# Patient Record
Sex: Female | Born: 1970
Health system: Southern US, Community
[De-identification: ages and names within clinical notes are randomized; demographics above are authoritative.]

## PROBLEM LIST (undated history)

## (undated) DIAGNOSIS — F329 Major depressive disorder, single episode, unspecified: Secondary | ICD-10-CM

## (undated) DIAGNOSIS — F32A Depression, unspecified: Secondary | ICD-10-CM

## (undated) HISTORY — PX: ABDOMINAL HYSTERECTOMY: SHX81

## (undated) HISTORY — DX: Major depressive disorder, single episode, unspecified: F32.9

## (undated) HISTORY — DX: Depression, unspecified: F32.A

---

## 2005-09-20 ENCOUNTER — Inpatient Hospital Stay: Payer: Self-pay | Admitting: Obstetrics and Gynecology

## 2006-10-20 ENCOUNTER — Ambulatory Visit: Payer: Self-pay | Admitting: Obstetrics and Gynecology

## 2014-02-11 ENCOUNTER — Ambulatory Visit: Payer: Self-pay | Admitting: Internal Medicine

## 2014-02-12 ENCOUNTER — Ambulatory Visit: Payer: Self-pay | Admitting: Internal Medicine

## 2016-04-06 ENCOUNTER — Other Ambulatory Visit: Payer: Self-pay | Admitting: Internal Medicine

## 2016-04-06 DIAGNOSIS — F329 Major depressive disorder, single episode, unspecified: Secondary | ICD-10-CM | POA: Diagnosis not present

## 2016-04-06 DIAGNOSIS — Z124 Encounter for screening for malignant neoplasm of cervix: Secondary | ICD-10-CM | POA: Diagnosis not present

## 2016-04-06 DIAGNOSIS — Z1231 Encounter for screening mammogram for malignant neoplasm of breast: Secondary | ICD-10-CM

## 2016-04-06 DIAGNOSIS — E2839 Other primary ovarian failure: Secondary | ICD-10-CM | POA: Diagnosis not present

## 2016-04-06 DIAGNOSIS — Z0001 Encounter for general adult medical examination with abnormal findings: Secondary | ICD-10-CM | POA: Diagnosis not present

## 2016-05-11 ENCOUNTER — Other Ambulatory Visit: Payer: Self-pay | Admitting: Internal Medicine

## 2016-05-11 ENCOUNTER — Ambulatory Visit
Admission: RE | Admit: 2016-05-11 | Discharge: 2016-05-11 | Disposition: A | Discharge status: Home / Self Care | Payer: BLUE CROSS/BLUE SHIELD | Source: Ambulatory Visit | Attending: Internal Medicine | Admitting: Internal Medicine

## 2016-05-11 DIAGNOSIS — Z1231 Encounter for screening mammogram for malignant neoplasm of breast: Secondary | ICD-10-CM | POA: Insufficient documentation

## 2016-06-01 DIAGNOSIS — Z23 Encounter for immunization: Secondary | ICD-10-CM | POA: Diagnosis not present

## 2016-08-13 DIAGNOSIS — K011 Impacted teeth: Secondary | ICD-10-CM | POA: Diagnosis not present

## 2016-08-13 DIAGNOSIS — K046 Periapical abscess with sinus: Secondary | ICD-10-CM | POA: Diagnosis not present

## 2017-02-10 DIAGNOSIS — J069 Acute upper respiratory infection, unspecified: Secondary | ICD-10-CM | POA: Diagnosis not present

## 2017-02-10 DIAGNOSIS — J029 Acute pharyngitis, unspecified: Secondary | ICD-10-CM | POA: Diagnosis not present

## 2017-02-14 ENCOUNTER — Other Ambulatory Visit: Payer: Self-pay | Admitting: Internal Medicine

## 2017-02-14 ENCOUNTER — Ambulatory Visit
Admission: RE | Admit: 2017-02-14 | Discharge: 2017-02-14 | Disposition: A | Discharge status: Home / Self Care | Payer: BLUE CROSS/BLUE SHIELD | Source: Ambulatory Visit | Attending: Internal Medicine | Admitting: Internal Medicine

## 2017-02-14 ENCOUNTER — Ambulatory Visit
Admission: AD | Admit: 2017-02-14 | Discharge: 2017-02-14 | Disposition: A | Discharge status: Home / Self Care | Payer: BLUE CROSS/BLUE SHIELD | Source: Ambulatory Visit | Attending: Internal Medicine | Admitting: Internal Medicine

## 2017-02-14 DIAGNOSIS — R059 Cough, unspecified: Secondary | ICD-10-CM

## 2017-02-14 DIAGNOSIS — R05 Cough: Secondary | ICD-10-CM

## 2017-02-14 DIAGNOSIS — R0602 Shortness of breath: Secondary | ICD-10-CM

## 2017-02-14 DIAGNOSIS — R0600 Dyspnea, unspecified: Secondary | ICD-10-CM | POA: Diagnosis not present

## 2017-02-14 DIAGNOSIS — J45991 Cough variant asthma: Secondary | ICD-10-CM | POA: Diagnosis not present

## 2017-05-04 DIAGNOSIS — J309 Allergic rhinitis, unspecified: Secondary | ICD-10-CM | POA: Diagnosis not present

## 2017-05-04 DIAGNOSIS — F39 Unspecified mood [affective] disorder: Secondary | ICD-10-CM | POA: Diagnosis not present

## 2017-05-04 DIAGNOSIS — E785 Hyperlipidemia, unspecified: Secondary | ICD-10-CM | POA: Diagnosis not present

## 2017-05-04 DIAGNOSIS — Z0001 Encounter for general adult medical examination with abnormal findings: Secondary | ICD-10-CM | POA: Diagnosis not present

## 2017-05-18 ENCOUNTER — Other Ambulatory Visit: Payer: Self-pay | Admitting: Nurse Practitioner

## 2017-05-18 DIAGNOSIS — Z1231 Encounter for screening mammogram for malignant neoplasm of breast: Secondary | ICD-10-CM

## 2017-05-31 DIAGNOSIS — Z23 Encounter for immunization: Secondary | ICD-10-CM | POA: Diagnosis not present

## 2017-06-14 ENCOUNTER — Ambulatory Visit
Admission: RE | Admit: 2017-06-14 | Discharge: 2017-06-14 | Disposition: A | Discharge status: Home / Self Care | Payer: BLUE CROSS/BLUE SHIELD | Source: Ambulatory Visit | Attending: Nurse Practitioner | Admitting: Nurse Practitioner

## 2017-06-14 DIAGNOSIS — Z1231 Encounter for screening mammogram for malignant neoplasm of breast: Secondary | ICD-10-CM | POA: Diagnosis not present

## 2018-04-28 ENCOUNTER — Other Ambulatory Visit: Payer: Self-pay | Admitting: Internal Medicine

## 2018-05-08 ENCOUNTER — Ambulatory Visit (INDEPENDENT_AMBULATORY_CARE_PROVIDER_SITE_OTHER): Payer: BLUE CROSS/BLUE SHIELD | Admitting: Nurse Practitioner

## 2018-05-08 ENCOUNTER — Encounter: Payer: Self-pay | Admitting: Nurse Practitioner

## 2018-05-08 VITALS — BP 118/70 | HR 66 | Resp 16 | Ht 68.0 in | Wt 160.4 lb

## 2018-05-08 DIAGNOSIS — Z1231 Encounter for screening mammogram for malignant neoplasm of breast: Secondary | ICD-10-CM

## 2018-05-08 DIAGNOSIS — N959 Unspecified menopausal and perimenopausal disorder: Secondary | ICD-10-CM | POA: Diagnosis not present

## 2018-05-08 DIAGNOSIS — F411 Generalized anxiety disorder: Secondary | ICD-10-CM

## 2018-05-08 DIAGNOSIS — Z0001 Encounter for general adult medical examination with abnormal findings: Secondary | ICD-10-CM

## 2018-05-08 DIAGNOSIS — R300 Dysuria: Secondary | ICD-10-CM

## 2018-05-08 DIAGNOSIS — E559 Vitamin D deficiency, unspecified: Secondary | ICD-10-CM | POA: Insufficient documentation

## 2018-05-08 DIAGNOSIS — Z1239 Encounter for other screening for malignant neoplasm of breast: Secondary | ICD-10-CM

## 2018-05-08 NOTE — Progress Notes (Signed)
[facility] [address] [place], [place] [address]  Internal MEDICINE  Office Visit Note  Patient Name: Heather Wolf  [id number]  [id number]  Date of Service: 05/08/2018   Pt is here for routine health maintenance examination  Chief Complaint  Patient presents with  . Annual Exam  . Depression  . Quality Metric Gaps    mammogram      The patient presents with no concerns or complaints. She is due to have routine, fasting labs done. She is also due to have screening mammogram.     Current Medication: Outpatient Encounter Medications as of 05/08/2018  Medication Sig  . estradiol (ESTRACE) 1 MG tablet TAKE 1 TABLET BY MOUTH EVERY DAY  . sertraline (ZOLOFT) 50 MG tablet TAKE 1 TABLET BY MOUTH EVERY DAY   No facility-administered encounter medications on file as of 05/08/2018.     Surgical History: Past Surgical History:  Procedure Laterality Date  . ABDOMINAL HYSTERECTOMY      Medical History: Past Medical History:  Diagnosis Date  . Depression     Family History: Family History  Problem Relation Age of Onset  . Hypertension Mother   . Heart attack Father       Review of Systems  Constitutional: Negative for activity change, chills, fatigue and unexpected weight change.  HENT: Negative for congestion, postnasal drip, rhinorrhea, sneezing and sore throat.   Eyes: Negative.  Negative for redness.  Respiratory: Negative for cough, chest tightness, shortness of breath and wheezing.   Cardiovascular: Negative for chest pain and palpitations.  Gastrointestinal: Negative for abdominal pain, constipation, diarrhea and nausea.  Endocrine: Negative for cold intolerance, heat intolerance, polydipsia, polyphagia and polyuria.  Genitourinary: Negative.  Negative for dysuria and frequency.  Musculoskeletal: Negative for arthralgias, back pain, joint swelling and neck pain.  Skin: Negative for rash.  Allergic/Immunologic: Negative for environmental  allergies.  Neurological: Negative for dizziness, tremors, numbness and headaches.  Hematological: Negative for adenopathy. Does not bruise/bleed easily.  Psychiatric/Behavioral: Negative for behavioral problems (Depression), sleep disturbance and suicidal ideas. The patient is nervous/anxious.      Today's Vitals   05/08/18 0915  BP: 118/70  Pulse: 66  Resp: 16  SpO2: 99%  Weight: 160 lb 6.4 oz (72.8 kg)  Height: 5\' 8"  (1.727 m)    Physical Exam  Constitutional: She is oriented to person, place, and time. She appears well-developed and well-nourished. No distress.  HENT:  Head: Normocephalic and atraumatic.  Nose: Nose normal.  Mouth/Throat: Oropharynx is clear and moist. No oropharyngeal exudate.  Eyes: Pupils are equal, round, and reactive to light. Conjunctivae and EOM are normal.  Neck: Normal range of motion. Neck supple. No JVD present. No tracheal deviation present. No thyromegaly present.  Cardiovascular: Normal rate, regular rhythm, normal heart sounds and intact distal pulses. Exam reveals no gallop and no friction rub.  No murmur heard. Pulmonary/Chest: Effort normal and breath sounds normal. No respiratory distress. She has no wheezes. She has no rales. She exhibits no tenderness. Right breast exhibits no inverted nipple, no mass, no nipple discharge, no skin change and no tenderness. Left breast exhibits no inverted nipple, no mass, no nipple discharge, no skin change and no tenderness.  Abdominal: Soft. Bowel sounds are normal. There is no tenderness.  Musculoskeletal: Normal range of motion.  Lymphadenopathy:    She has no cervical adenopathy.  Neurological: She is alert and oriented to person, place, and time. No cranial nerve deficit.  Skin: Skin is warm and  dry. Capillary refill takes less than 2 seconds. She is not diaphoretic.  Psychiatric: She has a normal mood and affect. Her behavior is normal. Judgment and thought content normal.  Nursing note and vitals  reviewed.  Assessment/Plan: 1. Encounter for general adult medical examination with abnormal findings Annual health maintenance exam today. - CBC with Differential/Platelet - Comprehensive metabolic panel - T4, free - TSH - Lipid panel  2. Unspecified menopausal and perimenopausal disorder Continue estradiol therapy daily.   3. Generalized anxiety disorder Well managed. Continue sertraline as prescribed.   4. Vitamin D deficiency - Vitamin D 1,25 dihydroxy  5. Screening for breast cancer - MM DIGITAL SCREENING BILATERAL; Future  6. Dysuria - UA/M w/rflx Culture, Routine  General Counseling: [name] verbalizes understanding of the findings of todays visit and agrees with plan of treatment. I have discussed any further diagnostic evaluation that may be needed or ordered today. We also reviewed her medications today. she has been encouraged to call the office with any questions or concerns that should arise related to todays visit.    Counseling:  This patient was seen by Vincent Gros FNP Collaboration with Dr Lyndon Code as a part of collaborative care agreement  Orders Placed This Encounter  Procedures  . MM DIGITAL SCREENING BILATERAL  . UA/M w/rflx Culture, Routine  . CBC with Differential/Platelet  . Comprehensive metabolic panel  . T4, free  . TSH  . Lipid panel  . Vitamin D 1,25 dihydroxy   .  Time spent: 17 Minutes      Lyndon Code, MD  Internal Medicine

## 2018-05-09 LAB — UA/M W/RFLX CULTURE, ROUTINE
Bilirubin, UA: NEGATIVE
Glucose, UA: NEGATIVE
KETONES UA: NEGATIVE
LEUKOCYTES UA: NEGATIVE
NITRITE UA: NEGATIVE
Protein, UA: NEGATIVE
RBC UA: NEGATIVE
Specific Gravity, UA: 1.006 (ref 1.005–1.030)
UUROB: 0.2 mg/dL (ref 0.2–1.0)
pH, UA: 5.5 (ref 5.0–7.5)

## 2018-05-09 LAB — MICROSCOPIC EXAMINATION: CASTS: NONE SEEN /LPF

## 2018-05-16 ENCOUNTER — Other Ambulatory Visit: Payer: Self-pay | Admitting: Nurse Practitioner

## 2018-05-16 DIAGNOSIS — E559 Vitamin D deficiency, unspecified: Secondary | ICD-10-CM | POA: Diagnosis not present

## 2018-05-16 DIAGNOSIS — Z0001 Encounter for general adult medical examination with abnormal findings: Secondary | ICD-10-CM | POA: Diagnosis not present

## 2018-05-17 LAB — COMPREHENSIVE METABOLIC PANEL
ALT: 11 IU/L (ref 0–32)
AST: 13 IU/L (ref 0–40)
Albumin/Globulin Ratio: 2.2 (ref 1.2–2.2)
Albumin: 4.4 g/dL (ref 3.5–5.5)
Alkaline Phosphatase: 63 IU/L (ref 39–117)
BUN/Creatinine Ratio: 19 (ref 9–23)
BUN: 15 mg/dL (ref 6–24)
Bilirubin Total: 0.3 mg/dL (ref 0.0–1.2)
CALCIUM: 9.1 mg/dL (ref 8.7–10.2)
CO2: 26 mmol/L (ref 20–29)
CREATININE: 0.77 mg/dL (ref 0.57–1.00)
Chloride: 101 mmol/L (ref 96–106)
GFR calc Af Amer: 107 mL/min/{1.73_m2} (ref >59)
GFR, EST NON AFRICAN AMERICAN: 93 mL/min/{1.73_m2} (ref >59)
GLUCOSE: 82 mg/dL (ref 65–99)
Globulin, Total: 2 g/dL (ref 1.5–4.5)
POTASSIUM: 4.1 mmol/L (ref 3.5–5.2)
Sodium: 141 mmol/L (ref 134–144)
Total Protein: 6.4 g/dL (ref 6.0–8.5)

## 2018-05-17 LAB — CBC
HEMOGLOBIN: 13.5 g/dL (ref 11.1–15.9)
Hematocrit: 40 % (ref 34.0–46.6)
MCH: 29.9 pg (ref 26.6–33.0)
MCHC: 33.8 g/dL (ref 31.5–35.7)
MCV: 89 fL (ref 79–97)
Platelets: 223 10*3/uL (ref 150–450)
RBC: 4.52 x10E6/uL (ref 3.77–5.28)
RDW: 13 % (ref 12.3–15.4)
WBC: 4.3 10*3/uL (ref 3.4–10.8)

## 2018-05-17 LAB — LIPID PANEL W/O CHOL/HDL RATIO
CHOLESTEROL TOTAL: 217 mg/dL — AB (ref 100–199)
HDL: 79 mg/dL (ref >39)
LDL CALC: 125 mg/dL — AB (ref 0–99)
Triglycerides: 66 mg/dL (ref 0–149)
VLDL CHOLESTEROL CAL: 13 mg/dL (ref 5–40)

## 2018-05-17 LAB — TSH: TSH: 1.16 u[IU]/mL (ref 0.450–4.500)

## 2018-05-17 LAB — T4, FREE: Free T4: 1.25 ng/dL (ref 0.82–1.77)

## 2018-05-17 LAB — T3: T3, Total: 113 ng/dL (ref 71–180)

## 2018-05-17 LAB — VITAMIN D 25 HYDROXY (VIT D DEFICIENCY, FRACTURES): VIT D 25 HYDROXY: 49.6 ng/mL (ref 30.0–100.0)

## 2018-05-18 ENCOUNTER — Telehealth: Payer: Self-pay

## 2018-05-18 NOTE — Telephone Encounter (Signed)
Left message on pt voicemail letting her know lab results for cholesterol and informed her to give me a call back with any concerns or questions

## 2018-05-18 NOTE — Telephone Encounter (Signed)
-----   Message from Carlean Jews, NP sent at 05/18/2018  8:14 AM EDT ----- Please let the patient know that labs indicate a mild elevation of cholesterol panel. We could send her out prudent diet information. All other labs were good. Thanks.

## 2018-08-21 ENCOUNTER — Ambulatory Visit
Admission: RE | Admit: 2018-08-21 | Discharge: 2018-08-21 | Disposition: A | Discharge status: Home / Self Care | Payer: BLUE CROSS/BLUE SHIELD | Source: Ambulatory Visit | Attending: Nurse Practitioner | Admitting: Nurse Practitioner

## 2018-08-21 DIAGNOSIS — Z1239 Encounter for other screening for malignant neoplasm of breast: Secondary | ICD-10-CM

## 2018-08-21 DIAGNOSIS — Z1231 Encounter for screening mammogram for malignant neoplasm of breast: Secondary | ICD-10-CM | POA: Diagnosis not present

## 2018-10-23 ENCOUNTER — Other Ambulatory Visit: Payer: Self-pay

## 2018-10-23 MED ORDER — SERTRALINE HCL 50 MG PO TABS: 50.0000 mg | ORAL_TABLET | Freq: Every day | ORAL | 1 refills | Status: DC

## 2018-10-23 MED ORDER — ESTRADIOL 1 MG PO TABS: 1.0000 mg | ORAL_TABLET | Freq: Every day | ORAL | 1 refills | Status: DC

## 2019-04-19 ENCOUNTER — Other Ambulatory Visit: Payer: Self-pay

## 2019-04-19 MED ORDER — SERTRALINE HCL 50 MG PO TABS: 50.0000 mg | ORAL_TABLET | Freq: Every day | ORAL | 0 refills | Status: DC

## 2019-04-19 MED ORDER — ESTRADIOL 1 MG PO TABS: 1.0000 mg | ORAL_TABLET | Freq: Every day | ORAL | 0 refills | Status: DC

## 2019-04-19 NOTE — Telephone Encounter (Signed)
Spoke with pt advised she need keep appt for further refills and she only come once a year

## 2019-04-23 ENCOUNTER — Other Ambulatory Visit: Payer: Self-pay

## 2019-04-23 MED ORDER — SERTRALINE HCL 50 MG PO TABS: 50.0000 mg | ORAL_TABLET | Freq: Every day | ORAL | 0 refills | Status: DC

## 2019-04-23 MED ORDER — ESTRADIOL 1 MG PO TABS: 1.0000 mg | ORAL_TABLET | Freq: Every day | ORAL | 0 refills | Status: DC

## 2019-05-11 ENCOUNTER — Encounter: Payer: Self-pay | Admitting: Nurse Practitioner

## 2019-05-11 ENCOUNTER — Other Ambulatory Visit: Payer: Self-pay

## 2019-05-11 ENCOUNTER — Ambulatory Visit: Payer: BC Managed Care – PPO | Admitting: Nurse Practitioner

## 2019-05-11 VITALS — BP 127/87 | HR 86 | Temp 97.7°F | Resp 16 | Ht 68.0 in | Wt 167.0 lb

## 2019-05-11 DIAGNOSIS — R300 Dysuria: Secondary | ICD-10-CM | POA: Diagnosis not present

## 2019-05-11 DIAGNOSIS — I7300 Raynaud's syndrome without gangrene: Secondary | ICD-10-CM | POA: Diagnosis not present

## 2019-05-11 DIAGNOSIS — E042 Nontoxic multinodular goiter: Secondary | ICD-10-CM | POA: Diagnosis not present

## 2019-05-11 DIAGNOSIS — Z0001 Encounter for general adult medical examination with abnormal findings: Secondary | ICD-10-CM

## 2019-05-11 DIAGNOSIS — R5383 Other fatigue: Secondary | ICD-10-CM

## 2019-05-11 NOTE — Progress Notes (Signed)
[facility] [place], [place] [address]  Internal MEDICINE  Office Visit Note  Patient Name: Heather Wolf  [id number]  [id number]  Date of Service: 05/11/2019   Pt is here for routine health maintenance examination  Chief Complaint  Patient presents with  . Annual Exam    pt has felt a lot change in moods, believes it could be due to hormone changes  . Depression    feels a lot more anxious  . Weight Gain     Ms. Hirst presents today for an annual physical exam. She reports symptoms of mood irritability and lability, an ongoing increase in weight and appetite beginning last year, decreased energy levels, increased anxiety with occasional panic attacks, decreased libido, intermittent cold intolerance, and hot flashes. She also reports increased work/life stress, including heavier workload, being in the process of selling her home, social isolation related to living an hour away from most family and friends, and her daughter being home from college. She states that she has recently ordered and started taking Plexus for appetite control and for gastrointestinal regulation.    Current Medication: Outpatient Encounter Medications as of 05/11/2019  Medication Sig  . estradiol (ESTRACE) 1 MG tablet Take 1 tablet (1 mg total) by mouth daily.  . sertraline (ZOLOFT) 50 MG tablet Take 1 tablet (50 mg total) by mouth daily.   No facility-administered encounter medications on file as of 05/11/2019.     Surgical History: Past Surgical History:  Procedure Laterality Date  . ABDOMINAL HYSTERECTOMY      Medical History: Past Medical History:  Diagnosis Date  . Depression     Family History: Family History  Problem Relation Age of Onset  . Hypertension Mother   . Heart attack Father   . Breast cancer Neg Hx       Review of Systems  Constitutional: Positive for appetite change and fatigue. Negative for activity change, chills, diaphoresis and fever.   HENT: Negative for congestion, postnasal drip, rhinorrhea and sore throat.   Respiratory: Negative for cough and wheezing.   Cardiovascular: Negative for chest pain and palpitations.  Gastrointestinal: Negative for constipation, diarrhea, nausea and vomiting.  Endocrine: Positive for cold intolerance. Negative for polydipsia and polyuria.  Genitourinary: Negative for dyspareunia and frequency.  Musculoskeletal: Negative for arthralgias and myalgias.  Skin: Negative.   Allergic/Immunologic: Negative.   Neurological: Negative for dizziness and headaches.  Hematological: Negative.   Psychiatric/Behavioral: Positive for dysphoric mood. The patient is nervous/anxious.     Today's Vitals   05/11/19 0916  BP: 127/87  Pulse: 86  Resp: 16  Temp: 97.7 F (36.5 C)  SpO2: 98%  Weight: 167 lb (75.8 kg)  Height: 5\' 8"  (1.727 m)   Body mass index is 25.39 kg/m.  Physical Exam Vitals signs and nursing note reviewed.  Constitutional:      General: She is not in acute distress.    Appearance: Normal appearance. She is not ill-appearing.  HENT:     Head: Normocephalic and atraumatic.     Nose: Nose normal.  Eyes:     Extraocular Movements: Extraocular movements intact.     Pupils: Pupils are equal, round, and reactive to light.  Neck:     Musculoskeletal: Normal range of motion and neck supple.     Thyroid: Thyromegaly present. No thyroid mass or thyroid tenderness.     Vascular: No carotid bruit.     Trachea: Trachea normal.  Cardiovascular:     Rate and  Rhythm: Normal rate and regular rhythm.     Pulses:          Radial pulses are 2+ on the right side and 2+ on the left side.       Dorsalis pedis pulses are 1+ on the right side and 1+ on the left side.     Heart sounds: Normal heart sounds.  Pulmonary:     Effort: Pulmonary effort is normal.     Breath sounds: Normal breath sounds.  Chest:     Chest wall: No mass, lacerations, deformity, swelling, tenderness, crepitus or edema.      Breasts: Tanner Score is 5. Breasts are symmetrical.        Right: Normal. No swelling, bleeding, inverted nipple, mass, nipple discharge, skin change or tenderness.        Left: Normal. No swelling, bleeding, inverted nipple, mass, nipple discharge, skin change or tenderness.  Abdominal:     General: Abdomen is flat. Bowel sounds are normal. There is no distension.     Palpations: Abdomen is soft. There is no mass.     Tenderness: There is no abdominal tenderness. There is no guarding or rebound.     Hernia: No hernia is present.  Musculoskeletal:     Right lower leg: No edema.     Left lower leg: No edema.  Lymphadenopathy:     Cervical: No cervical adenopathy.     Upper Body:     Right upper body: No supraclavicular, axillary or pectoral adenopathy.     Left upper body: No supraclavicular, axillary or pectoral adenopathy.  Skin:    General: Skin is warm and dry.     Capillary Refill: Capillary refill takes less than 2 seconds.     Coloration: Skin is not jaundiced or pale.     Findings: No bruising, erythema, lesion or rash.  Neurological:     Mental Status: She is alert and oriented to person, place, and time.     Deep Tendon Reflexes:     Reflex Scores:      Patellar reflexes are 1+ on the right side and 1+ on the left side. Psychiatric:        Mood and Affect: Mood normal.        Behavior: Behavior normal.        Thought Content: Thought content normal.        Judgment: Judgment normal.    Assessment/Plan: 1. Encounter for general adult medical examination with abnormal findings Annul health maintenance exam today  2. Other fatigue Check routie, fasting labs including thyroid panel and anemia panel.   3. Raynaud's disease without gangrene Connective tissue panel ordered. Will discuss with patient at next visit   4. Multinodular goiter Check thyroid panel with thyroid autoantibodies. Thyroid ultrasound ordered for further evaluation.  - US Soft Tissue Head/Neck;  Future  5. Dysuria - UA/M w/rflx Culture, Routine  General Counseling: Heather Wolf verbalizes understanding of the findings of todays visit and agrees with plan of treatment. I have discussed any further diagnostic evaluation that may be needed or ordered today. We also reviewed her medications today. she has been encouraged to call the office with any questions or concerns that should arise related to todays visit.    Counseling:  This patient was seen by Vincent GrosHeather Olga Bourbeau FNP Collaboration with Dr Lyndon CodeFozia M Khan as a part of collaborative care agreement  Orders Placed This Encounter  Procedures  . US Soft Tissue Head/Neck  . UA/M w/rflx Culture,  Routine    Time spent: 23 Minutes      Lyndon Code, MD  Internal Medicine

## 2019-05-12 LAB — UA/M W/RFLX CULTURE, ROUTINE
Bilirubin, UA: NEGATIVE
Glucose, UA: NEGATIVE
Ketones, UA: NEGATIVE
Leukocytes,UA: NEGATIVE
Nitrite, UA: NEGATIVE
Protein,UA: NEGATIVE
RBC, UA: NEGATIVE
Specific Gravity, UA: 1.005 (ref 1.005–1.030)
Urobilinogen, Ur: 0.2 mg/dL (ref 0.2–1.0)
pH, UA: 6.5 (ref 5.0–7.5)

## 2019-05-12 LAB — MICROSCOPIC EXAMINATION
Casts: NONE SEEN /lpf
RBC: NONE SEEN /hpf (ref 0–2)

## 2019-05-14 ENCOUNTER — Other Ambulatory Visit: Payer: Self-pay | Admitting: Nurse Practitioner

## 2019-05-14 DIAGNOSIS — E559 Vitamin D deficiency, unspecified: Secondary | ICD-10-CM | POA: Diagnosis not present

## 2019-05-14 DIAGNOSIS — Z0001 Encounter for general adult medical examination with abnormal findings: Secondary | ICD-10-CM | POA: Diagnosis not present

## 2019-05-14 DIAGNOSIS — R5383 Other fatigue: Secondary | ICD-10-CM | POA: Diagnosis not present

## 2019-05-14 DIAGNOSIS — I7300 Raynaud's syndrome without gangrene: Secondary | ICD-10-CM | POA: Diagnosis not present

## 2019-05-16 LAB — CBC
Hematocrit: 41 % (ref 34.0–46.6)
Hemoglobin: 13.7 g/dL (ref 11.1–15.9)
MCH: 29.7 pg (ref 26.6–33.0)
MCHC: 33.4 g/dL (ref 31.5–35.7)
MCV: 89 fL (ref 79–97)
Platelets: 245 x10E3/uL (ref 150–450)
RBC: 4.61 x10E6/uL (ref 3.77–5.28)
RDW: 11.9 % (ref 11.7–15.4)
WBC: 4.3 x10E3/uL (ref 3.4–10.8)

## 2019-05-16 LAB — COMPREHENSIVE METABOLIC PANEL WITH GFR
ALT: 14 IU/L (ref 0–32)
AST: 17 IU/L (ref 0–40)
Albumin/Globulin Ratio: 2.3 — ABNORMAL HIGH (ref 1.2–2.2)
Albumin: 4.8 g/dL (ref 3.8–4.8)
Alkaline Phosphatase: 82 IU/L (ref 39–117)
BUN/Creatinine Ratio: 15 (ref 9–23)
BUN: 13 mg/dL (ref 6–24)
Bilirubin Total: 0.3 mg/dL (ref 0.0–1.2)
CO2: 25 mmol/L (ref 20–29)
Calcium: 9.4 mg/dL (ref 8.7–10.2)
Chloride: 102 mmol/L (ref 96–106)
Creatinine, Ser: 0.87 mg/dL (ref 0.57–1.00)
GFR calc Af Amer: 92 mL/min/1.73
GFR calc non Af Amer: 80 mL/min/1.73
Globulin, Total: 2.1 g/dL (ref 1.5–4.5)
Glucose: 84 mg/dL (ref 65–99)
Potassium: 4.3 mmol/L (ref 3.5–5.2)
Sodium: 140 mmol/L (ref 134–144)
Total Protein: 6.9 g/dL (ref 6.0–8.5)

## 2019-05-16 LAB — LIPID PANEL W/O CHOL/HDL RATIO
Cholesterol, Total: 230 mg/dL — ABNORMAL HIGH (ref 100–199)
HDL: 72 mg/dL (ref >39)
LDL Chol Calc (NIH): 150 mg/dL — ABNORMAL HIGH (ref 0–99)
Triglycerides: 48 mg/dL (ref 0–149)
VLDL Cholesterol Cal: 8 mg/dL (ref 5–40)

## 2019-05-16 LAB — VITAMIN D 25 HYDROXY (VIT D DEFICIENCY, FRACTURES): Vit D, 25-Hydroxy: 35.4 ng/mL (ref 30.0–100.0)

## 2019-05-16 LAB — THYROID AUTOABS TEST GROUP
Anti-Thyroglobulin Antibodies: <1 [IU]/mL
Anti-Thyroid Peroxidase Ab: <1 [IU]/mL

## 2019-05-16 LAB — RHEUMATOID FACTOR: Rheumatoid fact SerPl-aCnc: <10 [IU]/mL (ref 0.0–13.9)

## 2019-05-16 LAB — T4, FREE: Free T4: 1.3 ng/dL (ref 0.82–1.77)

## 2019-05-16 LAB — SEDIMENTATION RATE: Sed Rate: 6 mm/h (ref 0–32)

## 2019-05-16 LAB — ANA W/REFLEX IF POSITIVE: Anti Nuclear Antibody (ANA): NEGATIVE

## 2019-05-16 LAB — TSH: TSH: 1.09 u[IU]/mL (ref 0.450–4.500)

## 2019-05-23 NOTE — Progress Notes (Signed)
All albs normal. Discuss at visit 10/23

## 2019-05-25 ENCOUNTER — Ambulatory Visit: Payer: BC Managed Care – PPO

## 2019-05-25 ENCOUNTER — Other Ambulatory Visit: Payer: Self-pay

## 2019-05-25 DIAGNOSIS — E042 Nontoxic multinodular goiter: Secondary | ICD-10-CM | POA: Diagnosis not present

## 2019-06-01 ENCOUNTER — Other Ambulatory Visit: Payer: Self-pay

## 2019-06-01 ENCOUNTER — Ambulatory Visit: Payer: BC Managed Care – PPO | Admitting: Nurse Practitioner

## 2019-06-01 ENCOUNTER — Encounter: Payer: Self-pay | Admitting: Nurse Practitioner

## 2019-06-01 VITALS — BP 120/65 | HR 76 | Temp 97.4°F | Resp 16 | Ht 68.0 in | Wt 169.0 lb

## 2019-06-01 DIAGNOSIS — E785 Hyperlipidemia, unspecified: Secondary | ICD-10-CM

## 2019-06-01 DIAGNOSIS — E042 Nontoxic multinodular goiter: Secondary | ICD-10-CM | POA: Diagnosis not present

## 2019-06-01 NOTE — Progress Notes (Signed)
[facility] [address] [place], [place] [address]  Internal MEDICINE  Office Visit Note  Patient Name: Heather Wolf  [id number]  [id number]  Date of Service: 06/17/2019  Chief Complaint  Patient presents with  . Follow-up    review ultrasound and labs    The patient is here for follow up visit. She recently had labs done. Her LDL and total cholesterol were mildly elevated. Her labs were otherwise normal. She had ultrasound of her thyroid. She has single sub-centimeter nodule on both lobes of the thyroid. Will continue to monitor this with ultrasounds every six months.       Current Medication: Outpatient Encounter Medications as of 06/01/2019  Medication Sig  . estradiol (ESTRACE) 1 MG tablet Take 1 tablet (1 mg total) by mouth daily.  . sertraline (ZOLOFT) 50 MG tablet Take 1 tablet (50 mg total) by mouth daily.   No facility-administered encounter medications on file as of 06/01/2019.     Surgical History: Past Surgical History:  Procedure Laterality Date  . ABDOMINAL HYSTERECTOMY      Medical History: Past Medical History:  Diagnosis Date  . Depression     Family History: Family History  Problem Relation Age of Onset  . Hypertension Mother   . Heart attack Father   . Breast cancer Neg Hx     Social History   Socioeconomic History  . Marital status: Married    Spouse name: Not on file  . Number of children: Not on file  . Years of education: Not on file  . Highest education level: Not on file  Occupational History  . Not on file  Social Needs  . Financial resource strain: Not on file  . Food insecurity    Worry: Not on file    Inability: Not on file  . Transportation needs    Medical: Not on file    Non-medical: Not on file  Tobacco Use  . Smoking status: Never Smoker  . Smokeless tobacco: Never Used  Substance and Sexual Activity  . Alcohol use: Yes    Comment: ocassional  . Drug use: Never  . Sexual activity: Not on file   Lifestyle  . Physical activity    Days per week: Not on file    Minutes per session: Not on file  . Stress: Not on file  Relationships  . Social Musician on phone: Not on file    Gets together: Not on file    Attends religious service: Not on file    Active member of club or organization: Not on file    Attends meetings of clubs or organizations: Not on file    Relationship status: Not on file  . Intimate partner violence    Fear of current or ex partner: Not on file    Emotionally abused: Not on file    Physically abused: Not on file    Forced sexual activity: Not on file  Other Topics Concern  . Not on file  Social History Narrative  . Not on file      Review of Systems  Constitutional: Negative for activity change, chills, fatigue and unexpected weight change.  HENT: Negative for congestion, postnasal drip, rhinorrhea, sneezing and sore throat.   Respiratory: Negative for cough, chest tightness, shortness of breath and wheezing.   Cardiovascular: Negative for chest pain and palpitations.  Gastrointestinal: Negative for abdominal pain, constipation, diarrhea, nausea and vomiting.  Endocrine: Negative for cold intolerance, heat  intolerance, polydipsia and polyuria.       Mildly enlarged thyroid gland.   Musculoskeletal: Negative for arthralgias, back pain, joint swelling and neck pain.  Skin: Negative for rash.  Allergic/Immunologic: Negative for environmental allergies.  Neurological: Negative for dizziness, tremors, numbness and headaches.  Hematological: Negative for adenopathy. Does not bruise/bleed easily.  Psychiatric/Behavioral: Negative for behavioral problems (Depression), sleep disturbance and suicidal ideas. The patient is not nervous/anxious.     Today's Vitals   06/01/19 1021  BP: 120/65  Pulse: 76  Resp: 16  Temp: (!) 97.4 F (36.3 C)  SpO2: 98%  Weight: 169 lb (76.7 kg)  Height: 5\' 8"  (1.727 m)   Body mass index is 25.7  kg/m.  Physical Exam Vitals signs and nursing note reviewed.  Constitutional:      General: She is not in acute distress.    Appearance: Normal appearance. She is well-developed. She is not diaphoretic.  HENT:     Head: Normocephalic and atraumatic.     Mouth/Throat:     Pharynx: No oropharyngeal exudate.  Eyes:     Extraocular Movements: Extraocular movements intact.     Pupils: Pupils are equal, round, and reactive to light.  Neck:     Musculoskeletal: Normal range of motion and neck supple.     Thyroid: Thyromegaly present.     Vascular: No JVD.     Trachea: No tracheal deviation.     Comments: Mildly enlarged thyroid gland.  Cardiovascular:     Rate and Rhythm: Normal rate and regular rhythm.     Heart sounds: Normal heart sounds. No murmur. No friction rub. No gallop.   Pulmonary:     Effort: Pulmonary effort is normal. No respiratory distress.     Breath sounds: No wheezing or rales.  Chest:     Chest wall: No tenderness.  Abdominal:     General: Bowel sounds are normal.     Palpations: Abdomen is soft.  Musculoskeletal: Normal range of motion.  Lymphadenopathy:     Cervical: No cervical adenopathy.  Skin:    General: Skin is warm and dry.  Neurological:     Mental Status: She is alert and oriented to person, place, and time.     Cranial Nerves: No cranial nerve deficit.  Psychiatric:        Behavior: Behavior normal.        Thought Content: Thought content normal.        Judgment: Judgment normal.    Assessment/Plan: 1. Multinodular goiter Reviewed thyroid ultrasound with the patient. Shows sub-centimeter nodules on both lobes of the thyroid. Repeat ultrasound in 6 months for surveillance..  - US Soft Tissue Head/Neck; Future  2. Hyperlipidemia LDL goal <100 Reviewed labs. Mild elevation of LDL and total cholesterol. Reviewed heart healthy diet to improve cholesterol panel.   General Counseling: Mazel verbalizes understanding of the findings of todays  visit and agrees with plan of treatment. I have discussed any further diagnostic evaluation that may be needed or ordered today. We also reviewed her medications today. she has been encouraged to call the office with any questions or concerns that should arise related to todays visit.  This patient was seen by Leretha Pol FNP Collaboration with Dr Lavera Guise as a part of collaborative care agreement  Orders Placed This Encounter  Procedures  . US Soft Tissue Head/Neck      Time spent: 8 Minutes      Dr Lavera Guise Internal medicine

## 2019-06-17 DIAGNOSIS — E785 Hyperlipidemia, unspecified: Secondary | ICD-10-CM | POA: Insufficient documentation

## 2019-06-26 DIAGNOSIS — F4323 Adjustment disorder with mixed anxiety and depressed mood: Secondary | ICD-10-CM | POA: Diagnosis not present

## 2019-06-26 DIAGNOSIS — F430 Acute stress reaction: Secondary | ICD-10-CM | POA: Diagnosis not present

## 2019-06-26 DIAGNOSIS — Z630 Problems in relationship with spouse or partner: Secondary | ICD-10-CM | POA: Diagnosis not present

## 2019-06-26 DIAGNOSIS — F329 Major depressive disorder, single episode, unspecified: Secondary | ICD-10-CM | POA: Diagnosis not present

## 2019-07-03 DIAGNOSIS — F329 Major depressive disorder, single episode, unspecified: Secondary | ICD-10-CM | POA: Diagnosis not present

## 2019-07-03 DIAGNOSIS — Z630 Problems in relationship with spouse or partner: Secondary | ICD-10-CM | POA: Diagnosis not present

## 2019-07-03 DIAGNOSIS — F430 Acute stress reaction: Secondary | ICD-10-CM | POA: Diagnosis not present

## 2019-08-15 ENCOUNTER — Other Ambulatory Visit: Payer: Self-pay

## 2019-08-15 MED ORDER — ESTRADIOL 1 MG PO TABS: 1.0000 mg | ORAL_TABLET | Freq: Every day | ORAL | 0 refills | Status: DC

## 2019-08-15 MED ORDER — SERTRALINE HCL 50 MG PO TABS: 50.0000 mg | ORAL_TABLET | Freq: Every day | ORAL | 0 refills | Status: DC

## 2019-10-27 ENCOUNTER — Ambulatory Visit: Payer: Self-pay | Attending: Internal Medicine

## 2019-10-27 DIAGNOSIS — Z23 Encounter for immunization: Secondary | ICD-10-CM

## 2019-10-27 NOTE — Progress Notes (Signed)
   Covid-19 Vaccination Clinic  Name:  Heather Wolf    MRN: [id number] DOB: 02-14-1971  10/27/2019  [name] was observed post Covid-19 immunization for 15 minutes without incident. She was provided with Vaccine Information Sheet and instruction to access the V-Safe system.   [name] was instructed to call 911 with any severe reactions post vaccine: Marland Kitchen Difficulty breathing  . Swelling of face and throat  . A fast heartbeat  . A bad rash all over body  . Dizziness and weakness   Immunizations Administered    Name Date Dose VIS Date Route   Pfizer COVID-19 Vaccine 10/27/2019 11:18 AM 0.3 mL 07/20/2019 Intramuscular   Manufacturer: [employer], [employer]   Lot: [id number]   NDC: [address]-3

## 2019-11-05 DIAGNOSIS — Z20828 Contact with and (suspected) exposure to other viral communicable diseases: Secondary | ICD-10-CM | POA: Diagnosis not present

## 2019-11-05 DIAGNOSIS — Z7189 Other specified counseling: Secondary | ICD-10-CM | POA: Diagnosis not present

## 2019-11-20 ENCOUNTER — Ambulatory Visit: Payer: BLUE CROSS/BLUE SHIELD | Attending: Internal Medicine

## 2019-11-20 DIAGNOSIS — Z23 Encounter for immunization: Secondary | ICD-10-CM

## 2019-11-20 NOTE — Progress Notes (Signed)
   Covid-19 Vaccination Clinic  Name:  Heather Wolf    MRN: [id number] DOB: 05-21-1971  11/20/2019  [name] was observed post Covid-19 immunization for 15 minutes without incident. She was provided with Vaccine Information Sheet and instruction to access the V-Safe system.   [name] was instructed to call 911 with any severe reactions post vaccine: Marland Kitchen Difficulty breathing  . Swelling of face and throat  . A fast heartbeat  . A bad rash all over body  . Dizziness and weakness   Immunizations Administered    Name Date Dose VIS Date Route   Pfizer COVID-19 Vaccine 11/20/2019  3:48 PM 0.3 mL 07/20/2019 Intramuscular   Manufacturer: [employer], [employer]   Lot: [id number]   NDC: [address]-4

## 2019-11-23 ENCOUNTER — Other Ambulatory Visit: Payer: Self-pay

## 2019-11-23 ENCOUNTER — Ambulatory Visit: Payer: BC Managed Care – PPO

## 2019-11-23 DIAGNOSIS — E042 Nontoxic multinodular goiter: Secondary | ICD-10-CM

## 2019-11-28 ENCOUNTER — Telehealth: Payer: Self-pay

## 2019-11-28 NOTE — Telephone Encounter (Signed)
Confirmed and screened for 11-30-19 ov. °

## 2019-11-30 ENCOUNTER — Other Ambulatory Visit: Payer: Self-pay

## 2019-11-30 ENCOUNTER — Ambulatory Visit: Payer: BC Managed Care – PPO | Admitting: Nurse Practitioner

## 2019-11-30 ENCOUNTER — Encounter: Payer: Self-pay | Admitting: Nurse Practitioner

## 2019-11-30 VITALS — BP 108/55 | HR 71 | Temp 97.3°F | Resp 16 | Ht 68.0 in | Wt 167.6 lb

## 2019-11-30 DIAGNOSIS — F411 Generalized anxiety disorder: Secondary | ICD-10-CM | POA: Diagnosis not present

## 2019-11-30 DIAGNOSIS — E042 Nontoxic multinodular goiter: Secondary | ICD-10-CM

## 2019-11-30 DIAGNOSIS — N959 Unspecified menopausal and perimenopausal disorder: Secondary | ICD-10-CM | POA: Diagnosis not present

## 2019-11-30 MED ORDER — SERTRALINE HCL 50 MG PO TABS: 50.0000 mg | ORAL_TABLET | Freq: Every day | ORAL | 1 refills | Status: DC

## 2019-11-30 MED ORDER — ESTRADIOL 1 MG PO TABS: 1.0000 mg | ORAL_TABLET | Freq: Every day | ORAL | 1 refills | Status: DC

## 2019-11-30 NOTE — Progress Notes (Signed)
[facility] [place], [place] [address]  Internal MEDICINE  Office Visit Note  Patient Name: Heather Wolf  [id number]  [id number]  Date of Service: 12/17/2019  Chief Complaint  Patient presents with  . Follow-up  . Depression    The patient is here for routine follow up. She states that she is doing well, overall. She is concerned about difficulty with weight gain. She states that she is the heaviest she has ever been while not pregnant. She has seen Santiago Glad at [facility]. Started yesterday on "blue Print Diet."   She has had both Pfizer covid 19 vaccines. These are documented in her immunization record. She had thyroid ultrasound prior to this visit. She does have mild thyromegaly. There are multinodular changes, bilaterally. nodulse are all >1cm in diameter       Current Medication: Outpatient Encounter Medications as of 11/30/2019  Medication Sig  . estradiol (ESTRACE) 1 MG tablet Take 1 tablet (1 mg total) by mouth daily.  . sertraline (ZOLOFT) 50 MG tablet Take 1 tablet (50 mg total) by mouth daily.  . [DISCONTINUED] estradiol (ESTRACE) 1 MG tablet Take 1 tablet (1 mg total) by mouth daily.  . [DISCONTINUED] sertraline (ZOLOFT) 50 MG tablet Take 1 tablet (50 mg total) by mouth daily.   No facility-administered encounter medications on file as of 11/30/2019.    Surgical History: Past Surgical History:  Procedure Laterality Date  . ABDOMINAL HYSTERECTOMY      Medical History: Past Medical History:  Diagnosis Date  . Depression     Family History: Family History  Problem Relation Age of Onset  . Hypertension Mother   . Heart attack Father   . Breast cancer Neg Hx     Social History   Socioeconomic History  . Marital status: Married    Spouse name: Not on file  . Number of children: Not on file  . Years of education: Not on file  . Highest education level: Not on file  Occupational History  . Not on file  Tobacco Use  .  Smoking status: Never Smoker  . Smokeless tobacco: Never Used  Substance and Sexual Activity  . Alcohol use: Yes    Comment: ocassional  . Drug use: Never  . Sexual activity: Not on file  Other Topics Concern  . Not on file  Social History Narrative  . Not on file   Social Determinants of Health   Financial Resource Strain:   . Difficulty of Paying Living Expenses:   Food Insecurity:   . Worried About Charity fundraiser in the Last Year:   . Arboriculturist in the Last Year:   Transportation Needs:   . Film/video editor (Medical):   Marland Kitchen Lack of Transportation (Non-Medical):   Physical Activity:   . Days of Exercise per Week:   . Minutes of Exercise per Session:   Stress:   . Feeling of Stress :   Social Connections:   . Frequency of Communication with Friends and Family:   . Frequency of Social Gatherings with Friends and Family:   . Attends Religious Services:   . Active Member of Clubs or Organizations:   . Attends Archivist Meetings:   Marland Kitchen Marital Status:   Intimate Partner Violence:   . Fear of Current or Ex-Partner:   . Emotionally Abused:   Marland Kitchen Physically Abused:   . Sexually Abused:       Review of Systems  Constitutional:  Negative for activity change, chills, fatigue and unexpected weight change.  HENT: Negative for congestion, postnasal drip, rhinorrhea, sneezing and sore throat.   Respiratory: Negative for cough, chest tightness, shortness of breath and wheezing.   Cardiovascular: Negative for chest pain and palpitations.  Gastrointestinal: Negative for abdominal pain, constipation, diarrhea, nausea and vomiting.  Endocrine: Negative for cold intolerance, heat intolerance, polydipsia and polyuria.       Mildly enlarged thyroid gland.   Musculoskeletal: Negative for arthralgias, back pain, joint swelling and neck pain.  Skin: Negative for rash.  Allergic/Immunologic: Negative for environmental allergies.  Neurological: Negative for dizziness,  tremors, numbness and headaches.  Hematological: Negative for adenopathy. Does not bruise/bleed easily.  Psychiatric/Behavioral: Negative for behavioral problems (Depression), sleep disturbance and suicidal ideas. The patient is not nervous/anxious.     Today's Vitals   11/30/19 1015  BP: (!) 108/55  Pulse: 71  Resp: 16  Temp: (!) 97.3 F (36.3 C)  SpO2: 96%  Weight: 167 lb 9.6 oz (76 kg)  Height: 5\' 8"  (1.727 m)   Body mass index is 25.48 kg/m.  Physical Exam Vitals and nursing note reviewed.  Constitutional:      General: She is not in acute distress.    Appearance: Normal appearance. She is well-developed. She is not diaphoretic.  HENT:     Head: Normocephalic and atraumatic.     Mouth/Throat:     Pharynx: No oropharyngeal exudate.  Eyes:     Extraocular Movements: Extraocular movements intact.     Pupils: Pupils are equal, round, and reactive to light.  Neck:     Thyroid: Thyromegaly present.     Vascular: No JVD.     Trachea: No tracheal deviation.     Comments: Mildly enlarged thyroid gland.  Cardiovascular:     Rate and Rhythm: Normal rate and regular rhythm.     Heart sounds: Normal heart sounds. No murmur. No friction rub. No gallop.   Pulmonary:     Effort: Pulmonary effort is normal. No respiratory distress.     Breath sounds: Normal breath sounds. No wheezing or rales.  Chest:     Chest wall: No tenderness.  Abdominal:     Palpations: Abdomen is soft.  Musculoskeletal:        General: Normal range of motion.     Cervical back: Normal range of motion and neck supple.  Lymphadenopathy:     Cervical: No cervical adenopathy.  Skin:    General: Skin is warm and dry.  Neurological:     Mental Status: She is alert and oriented to person, place, and time.     Cranial Nerves: No cranial nerve deficit.  Psychiatric:        Behavior: Behavior normal.        Thought Content: Thought content normal.        Judgment: Judgment normal.    Assessment/Plan: 1.  Multinodular goiter Reviewed thyroid ultrasound with the patient. She does have mild thyromegaly. There are multinodular changes bilaterally. Nodules on both lobes of thyroid are <1cm in diameter. Will continue to monitor yearly.   2. Unspecified menopausal and perimenopausal disorder Stable. Continue esradiol 1mg  daily. - estradiol (ESTRACE) 1 MG tablet; Take 1 tablet (1 mg total) by mouth daily.  Dispense: 90 tablet; Refill: 1  3. Generalized anxiety disorder Well managed. Continue sertraline as prescribed.  - sertraline (ZOLOFT) 50 MG tablet; Take 1 tablet (50 mg total) by mouth daily.  Dispense: 90 tablet; Refill: 1  General Counseling:  Leyli verbalizes understanding of the findings of todays visit and agrees with plan of treatment. I have discussed any further diagnostic evaluation that may be needed or ordered today. We also reviewed her medications today. she has been encouraged to call the office with any questions or concerns that should arise related to todays visit.   This patient was seen by Vincent Gros FNP Collaboration with Dr Lyndon Code as a part of collaborative care agreement  Meds ordered this encounter  Medications  . estradiol (ESTRACE) 1 MG tablet    Sig: Take 1 tablet (1 mg total) by mouth daily.    Dispense:  90 tablet    Refill:  1    Order Specific Question:   Supervising Provider    Answer:   Lyndon Code [[id number]]  . sertraline (ZOLOFT) 50 MG tablet    Sig: Take 1 tablet (50 mg total) by mouth daily.    Dispense:  90 tablet    Refill:  1    Order Specific Question:   Supervising Provider    Answer:   Lyndon Code [[id number]]    Total time spent: 25 Minutes   Time spent includes review of chart, medications, test results, and follow up plan with the patient.      Dr Lyndon Code Internal medicine

## 2020-03-18 ENCOUNTER — Other Ambulatory Visit: Payer: Self-pay | Admitting: Nurse Practitioner

## 2020-03-18 DIAGNOSIS — Z1231 Encounter for screening mammogram for malignant neoplasm of breast: Secondary | ICD-10-CM

## 2020-04-09 ENCOUNTER — Other Ambulatory Visit: Payer: Self-pay

## 2020-04-09 ENCOUNTER — Ambulatory Visit
Admission: RE | Admit: 2020-04-09 | Discharge: 2020-04-09 | Disposition: A | Discharge status: Home / Self Care | Payer: BC Managed Care – PPO | Source: Ambulatory Visit | Attending: Nurse Practitioner | Admitting: Nurse Practitioner

## 2020-04-09 DIAGNOSIS — Z1231 Encounter for screening mammogram for malignant neoplasm of breast: Secondary | ICD-10-CM | POA: Diagnosis not present

## 2020-05-30 ENCOUNTER — Encounter: Payer: BC Managed Care – PPO | Admitting: Nurse Practitioner

## 2020-07-17 ENCOUNTER — Ambulatory Visit (INDEPENDENT_AMBULATORY_CARE_PROVIDER_SITE_OTHER): Payer: BC Managed Care – PPO | Admitting: Nurse Practitioner

## 2020-07-17 ENCOUNTER — Encounter: Payer: Self-pay | Admitting: Nurse Practitioner

## 2020-07-17 ENCOUNTER — Other Ambulatory Visit: Payer: Self-pay

## 2020-07-17 VITALS — BP 131/80 | HR 73 | Temp 97.8°F | Resp 16 | Ht 68.0 in | Wt 143.0 lb

## 2020-07-17 DIAGNOSIS — Z0001 Encounter for general adult medical examination with abnormal findings: Secondary | ICD-10-CM

## 2020-07-17 DIAGNOSIS — E785 Hyperlipidemia, unspecified: Secondary | ICD-10-CM

## 2020-07-17 DIAGNOSIS — E042 Nontoxic multinodular goiter: Secondary | ICD-10-CM | POA: Diagnosis not present

## 2020-07-17 DIAGNOSIS — R300 Dysuria: Secondary | ICD-10-CM

## 2020-07-17 NOTE — Progress Notes (Signed)
[facility] [address] [place], [place] [address]  Internal MEDICINE  Office Visit Note  Patient Name: Heather Wolf  [id number]  [id number]  Date of Service: 07/17/2020   Pt is here for routine health maintenance examination  Chief Complaint  Patient presents with  . Annual Exam  . Depression  . controlled substance form    Reviewed with PT     The patient is here for health maintenance exam. -20 pound weight loss since her last visit. Envision Blueprint for healthy life. No medications.  Does have a few supplements that she takes. Has taught her to eat sensibly during the day, how to et out in healthy way, and how not to overeat.  -due for routine, fasting labs -had screening mammogram 04/2020 which was negative  -history of multinodular goiter. Due to have surveillance thyroid ultrasound 11/2020    Current Medication: Outpatient Encounter Medications as of 07/17/2020  Medication Sig  . estradiol (ESTRACE) 1 MG tablet Take 1 tablet (1 mg total) by mouth daily.  . sertraline (ZOLOFT) 50 MG tablet Take 1 tablet (50 mg total) by mouth daily.   No facility-administered encounter medications on file as of 07/17/2020.    Surgical History: Past Surgical History:  Procedure Laterality Date  . ABDOMINAL HYSTERECTOMY      Medical History: Past Medical History:  Diagnosis Date  . Depression     Family History: Family History  Problem Relation Age of Onset  . Hypertension Mother   . Heart attack Father   . Breast cancer Neg Hx       Review of Systems  Constitutional: Negative for chills, fatigue and unexpected weight change.       20 pound weight loss since her most recent visit .  HENT: Negative for congestion, postnasal drip, rhinorrhea, sneezing and sore throat.   Respiratory: Negative for cough, chest tightness, shortness of breath and wheezing.   Cardiovascular: Negative for chest pain and palpitations.  Gastrointestinal: Negative for  abdominal pain, constipation, diarrhea, nausea and vomiting.  Endocrine: Negative for cold intolerance, heat intolerance, polydipsia and polyuria.       History of multinodular goiter.  Genitourinary: Negative for dysuria, frequency and urgency.  Musculoskeletal: Negative for arthralgias, back pain, joint swelling and neck pain.  Skin: Negative for rash.  Allergic/Immunologic: Negative for environmental allergies.  Neurological: Negative for dizziness, tremors, numbness and headaches.  Hematological: Negative for adenopathy. Does not bruise/bleed easily.  Psychiatric/Behavioral: Negative for behavioral problems (Depression), sleep disturbance and suicidal ideas. The patient is not nervous/anxious.     Today's Vitals   07/17/20 0909  BP: 131/80  Pulse: 73  Resp: 16  Temp: 97.8 F (36.6 C)  SpO2: 99%  Weight: 143 lb (64.9 kg)  Height: 5\' 8"  (1.727 m)   Body mass index is 21.74 kg/m.  Physical Exam Vitals and nursing note reviewed.  Constitutional:      General: She is not in acute distress.    Appearance: Normal appearance. She is well-developed and well-nourished. She is not diaphoretic.  HENT:     Head: Normocephalic and atraumatic.     Nose: Nose normal.     Mouth/Throat:     Mouth: Oropharynx is clear and moist.     Pharynx: No oropharyngeal exudate.  Eyes:     Extraocular Movements: EOM normal.     Pupils: Pupils are equal, round, and reactive to light.  Neck:     Thyroid: No thyromegaly.     Vascular: No  JVD.     Trachea: No tracheal deviation.     Comments: Mildly enlarged thyroid gland.  Cardiovascular:     Rate and Rhythm: Normal rate and regular rhythm.     Pulses: Normal pulses.     Heart sounds: Normal heart sounds. No murmur heard. No friction rub. No gallop.   Pulmonary:     Effort: Pulmonary effort is normal. No respiratory distress.     Breath sounds: Normal breath sounds. No wheezing or rales.  Chest:     Chest wall: No tenderness.  Abdominal:      General: Bowel sounds are normal.     Palpations: Abdomen is soft.     Tenderness: There is no abdominal tenderness.  Musculoskeletal:        General: Normal range of motion.     Cervical back: Normal range of motion and neck supple.  Lymphadenopathy:     Cervical: No cervical adenopathy.  Skin:    General: Skin is warm and dry.  Neurological:     General: No focal deficit present.     Mental Status: She is alert and oriented to person, place, and time.     Cranial Nerves: No cranial nerve deficit.  Psychiatric:        Mood and Affect: Mood and affect and mood normal.        Behavior: Behavior normal.        Thought Content: Thought content normal.        Judgment: Judgment normal.    Assessment/Plan: 1. Encounter for general adult medical examination with abnormal findings Annual health maintenance exam today. Order slip given to have routine, fasting labs  2. Multinodular goiter Repeat ultrasound of thyroid im 11/2020 - US THYROID; Future  3. Hyperlipidemia LDL goal <100 Check fasting lipid panel and treat as indicated   4. Dysuria - UA/M w/rflx Culture, Routine  General Counseling: [name] verbalizes understanding of the findings of todays visit and agrees with plan of treatment. I have discussed any further diagnostic evaluation that may be needed or ordered today. We also reviewed her medications today. she has been encouraged to call the office with any questions or concerns that should arise related to todays visit.    Counseling:  This patient was seen by Vincent Gros FNP Collaboration with Dr Lyndon Code as a part of collaborative care agreement  Orders Placed This Encounter  Procedures  . US THYROID  . UA/M w/rflx Culture, Routine     Total time spent: 30 Minutes  Time spent includes review of chart, medications, test results, and follow up plan with the patient.     Lyndon Code, MD  Internal Medicine

## 2020-07-18 ENCOUNTER — Other Ambulatory Visit: Payer: Self-pay | Admitting: Nurse Practitioner

## 2020-07-18 DIAGNOSIS — R5383 Other fatigue: Secondary | ICD-10-CM | POA: Diagnosis not present

## 2020-07-18 DIAGNOSIS — E785 Hyperlipidemia, unspecified: Secondary | ICD-10-CM | POA: Diagnosis not present

## 2020-07-18 DIAGNOSIS — E559 Vitamin D deficiency, unspecified: Secondary | ICD-10-CM | POA: Diagnosis not present

## 2020-07-18 DIAGNOSIS — E756 Lipid storage disorder, unspecified: Secondary | ICD-10-CM | POA: Diagnosis not present

## 2020-07-18 DIAGNOSIS — R946 Abnormal results of thyroid function studies: Secondary | ICD-10-CM | POA: Diagnosis not present

## 2020-07-18 DIAGNOSIS — Z0001 Encounter for general adult medical examination with abnormal findings: Secondary | ICD-10-CM | POA: Diagnosis not present

## 2020-07-18 DIAGNOSIS — E079 Disorder of thyroid, unspecified: Secondary | ICD-10-CM | POA: Diagnosis not present

## 2020-07-18 DIAGNOSIS — D691 Qualitative platelet defects: Secondary | ICD-10-CM | POA: Diagnosis not present

## 2020-07-19 LAB — CBC
Hematocrit: 43.2 % (ref 34.0–46.6)
Hemoglobin: 14.7 g/dL (ref 11.1–15.9)
MCH: 30.3 pg (ref 26.6–33.0)
MCHC: 34 g/dL (ref 31.5–35.7)
MCV: 89 fL (ref 79–97)
Platelets: 263 10*3/uL (ref 150–450)
RBC: 4.85 x10E6/uL (ref 3.77–5.28)
RDW: 12.3 % (ref 11.7–15.4)
WBC: 6.3 10*3/uL (ref 3.4–10.8)

## 2020-07-19 LAB — COMPREHENSIVE METABOLIC PANEL
ALT: 9 IU/L (ref 0–32)
AST: 16 IU/L (ref 0–40)
Albumin/Globulin Ratio: 2.3 — ABNORMAL HIGH (ref 1.2–2.2)
Albumin: 4.8 g/dL (ref 3.8–4.8)
Alkaline Phosphatase: 89 IU/L (ref 44–121)
BUN/Creatinine Ratio: 16 (ref 9–23)
BUN: 13 mg/dL (ref 6–24)
Bilirubin Total: 0.4 mg/dL (ref 0.0–1.2)
CO2: 24 mmol/L (ref 20–29)
Calcium: 9.6 mg/dL (ref 8.7–10.2)
Chloride: 102 mmol/L (ref 96–106)
Creatinine, Ser: 0.81 mg/dL (ref 0.57–1.00)
GFR calc Af Amer: 99 mL/min/{1.73_m2} (ref >59)
GFR calc non Af Amer: 86 mL/min/{1.73_m2} (ref >59)
Globulin, Total: 2.1 g/dL (ref 1.5–4.5)
Glucose: 86 mg/dL (ref 65–99)
Potassium: 4.2 mmol/L (ref 3.5–5.2)
Sodium: 144 mmol/L (ref 134–144)
Total Protein: 6.9 g/dL (ref 6.0–8.5)

## 2020-07-19 LAB — TSH: TSH: 1.28 u[IU]/mL (ref 0.450–4.500)

## 2020-07-19 LAB — LIPID PANEL WITH LDL/HDL RATIO
Cholesterol, Total: 247 mg/dL — ABNORMAL HIGH (ref 100–199)
HDL: 88 mg/dL (ref >39)
LDL Chol Calc (NIH): 148 mg/dL — ABNORMAL HIGH (ref 0–99)
LDL/HDL Ratio: 1.7 ratio (ref 0.0–3.2)
Triglycerides: 65 mg/dL (ref 0–149)
VLDL Cholesterol Cal: 11 mg/dL (ref 5–40)

## 2020-07-19 LAB — VITAMIN D 25 HYDROXY (VIT D DEFICIENCY, FRACTURES): Vit D, 25-Hydroxy: 36.8 ng/mL (ref 30.0–100.0)

## 2020-07-19 LAB — T4, FREE: Free T4: 1.44 ng/dL (ref 0.82–1.77)

## 2020-07-25 LAB — UA/M W/RFLX CULTURE, ROUTINE
Bilirubin, UA: NEGATIVE
Glucose, UA: NEGATIVE
Ketones, UA: NEGATIVE
Leukocytes,UA: NEGATIVE
Nitrite, UA: NEGATIVE
Protein,UA: NEGATIVE
RBC, UA: NEGATIVE
Specific Gravity, UA: 1.022 (ref 1.005–1.030)
Urobilinogen, Ur: 0.2 mg/dL (ref 0.2–1.0)
pH, UA: 6.5 (ref 5.0–7.5)

## 2020-07-25 LAB — MICROSCOPIC EXAMINATION: Casts: NONE SEEN /lpf

## 2020-07-25 LAB — URINE CULTURE, REFLEX

## 2020-07-27 ENCOUNTER — Other Ambulatory Visit: Payer: Self-pay | Admitting: Nurse Practitioner

## 2020-07-27 DIAGNOSIS — N390 Urinary tract infection, site not specified: Secondary | ICD-10-CM

## 2020-07-27 MED ORDER — NITROFURANTOIN MONOHYD MACRO 100 MG PO CAPS: 100.0000 mg | ORAL_CAPSULE | Freq: Two times a day (BID) | ORAL | 0 refills | Status: DC

## 2020-07-27 NOTE — Progress Notes (Signed)
Continued mild elevation of LDL and total cholesterol. Less than average risk of developing CV disease based on lipids. Continue to follow prudent diet and exercise routinely. Other labs good. Discuss at next visit.

## 2020-07-27 NOTE — Progress Notes (Signed)
Please let the patient know that urine taken during physical showed infection. Added macrobid 100mg  twice daily for 7 days and sent to her pharmacy. Thanks.

## 2020-07-28 ENCOUNTER — Telehealth: Payer: Self-pay

## 2020-07-28 ENCOUNTER — Other Ambulatory Visit: Payer: Self-pay

## 2020-07-28 DIAGNOSIS — N390 Urinary tract infection, site not specified: Secondary | ICD-10-CM

## 2020-07-28 MED ORDER — NITROFURANTOIN MONOHYD MACRO 100 MG PO CAPS: 100.0000 mg | ORAL_CAPSULE | Freq: Two times a day (BID) | ORAL | 0 refills | Status: DC

## 2020-07-28 NOTE — Telephone Encounter (Signed)
Thank you :)

## 2020-07-28 NOTE — Telephone Encounter (Signed)
-----   Message from Carlean Jews, NP sent at 07/27/2020 10:19 PM EST ----- Please let the patient know that urine taken during physical showed infection. Added macrobid 100mg  twice daily for 7 days and sent to her pharmacy. Thanks.

## 2020-07-28 NOTE — Telephone Encounter (Signed)
Spoke to pt, informed her that urine showed an infection and Macrobid was sent to pharmacy. Pt stated it was sent to her old pharmacy and provided the correct one. Resent meds to correct pharmacy.

## 2020-08-12 ENCOUNTER — Other Ambulatory Visit: Payer: Self-pay

## 2020-08-12 DIAGNOSIS — F411 Generalized anxiety disorder: Secondary | ICD-10-CM

## 2020-08-12 DIAGNOSIS — N959 Unspecified menopausal and perimenopausal disorder: Secondary | ICD-10-CM

## 2020-08-12 MED ORDER — SERTRALINE HCL 50 MG PO TABS: 50.0000 mg | ORAL_TABLET | Freq: Every day | ORAL | 1 refills | Status: DC

## 2020-08-12 MED ORDER — ESTRADIOL 1 MG PO TABS: 1.0000 mg | ORAL_TABLET | Freq: Every day | ORAL | 1 refills | Status: DC

## 2020-11-26 ENCOUNTER — Ambulatory Visit: Payer: BC Managed Care – PPO

## 2020-11-26 ENCOUNTER — Other Ambulatory Visit: Payer: Self-pay

## 2020-11-26 DIAGNOSIS — E042 Nontoxic multinodular goiter: Secondary | ICD-10-CM

## 2020-12-02 NOTE — Progress Notes (Signed)
Results were sent to [facility] who sent them to me today. Please review and advise.

## 2020-12-03 NOTE — Progress Notes (Signed)
Thyroid u/s is reviewed and will be discussed on next follow up

## 2020-12-18 DIAGNOSIS — F430 Acute stress reaction: Secondary | ICD-10-CM | POA: Diagnosis not present

## 2020-12-18 DIAGNOSIS — Z630 Problems in relationship with spouse or partner: Secondary | ICD-10-CM | POA: Diagnosis not present

## 2020-12-18 DIAGNOSIS — F329 Major depressive disorder, single episode, unspecified: Secondary | ICD-10-CM | POA: Diagnosis not present

## 2020-12-30 DIAGNOSIS — F329 Major depressive disorder, single episode, unspecified: Secondary | ICD-10-CM | POA: Diagnosis not present

## 2020-12-30 DIAGNOSIS — F430 Acute stress reaction: Secondary | ICD-10-CM | POA: Diagnosis not present

## 2020-12-30 DIAGNOSIS — Z630 Problems in relationship with spouse or partner: Secondary | ICD-10-CM | POA: Diagnosis not present

## 2021-01-09 ENCOUNTER — Encounter: Payer: Self-pay | Admitting: Nurse Practitioner

## 2021-01-09 ENCOUNTER — Other Ambulatory Visit: Payer: Self-pay

## 2021-01-09 ENCOUNTER — Ambulatory Visit: Payer: BC Managed Care – PPO | Admitting: Nurse Practitioner

## 2021-01-09 ENCOUNTER — Ambulatory Visit (INDEPENDENT_AMBULATORY_CARE_PROVIDER_SITE_OTHER): Payer: BC Managed Care – PPO | Admitting: Nurse Practitioner

## 2021-01-09 VITALS — BP 128/88 | HR 67 | Temp 98.4°F | Resp 16 | Ht 68.0 in | Wt 148.6 lb

## 2021-01-09 DIAGNOSIS — Z1212 Encounter for screening for malignant neoplasm of rectum: Secondary | ICD-10-CM | POA: Diagnosis not present

## 2021-01-09 DIAGNOSIS — Z1211 Encounter for screening for malignant neoplasm of colon: Secondary | ICD-10-CM

## 2021-01-09 DIAGNOSIS — E042 Nontoxic multinodular goiter: Secondary | ICD-10-CM | POA: Diagnosis not present

## 2021-01-09 NOTE — Progress Notes (Signed)
[facility] [address] [place], [place] [address]  Internal MEDICINE  Office Visit Note  Patient Name: Heather Wolf  [id number]  [id number]  Date of Service: 01/11/2021  Chief Complaint  Patient presents with  . Follow-up    Review [place]  . Quality Metric Gaps    Colonoscopy     HPI Heather Wolf presents for a follow up visit to discuss results of her thyroid ultrasound. Heather Wolf has a history of having a goiter and having multiple nodules on her thyroid. Imaging is done periodically to ensure the nodules have not increased in size. The thyroid ultrasound discussed was stable with no change from previous imaging. The nodules still measure too small for fine needle aspiration.    Current Medication: Outpatient Encounter Medications as of 01/09/2021  Medication Sig  . estradiol (ESTRACE) 1 MG tablet Take 1 tablet (1 mg total) by mouth daily.  . nitrofurantoin, macrocrystal-monohydrate, (MACROBID) 100 MG capsule Take 1 capsule (100 mg total) by mouth 2 (two) times daily.  . sertraline (ZOLOFT) 50 MG tablet Take 1 tablet (50 mg total) by mouth daily.   No facility-administered encounter medications on file as of 01/09/2021.    Surgical History: Past Surgical History:  Procedure Laterality Date  . ABDOMINAL HYSTERECTOMY      Medical History: Past Medical History:  Diagnosis Date  . Depression     Family History: Family History  Problem Relation Age of Onset  . Hypertension Mother   . Heart attack Father   . Breast cancer Neg Hx     Social History   Socioeconomic History  . Marital status: Married    Spouse name: Not on file  . Number of children: Not on file  . Years of education: Not on file  . Highest education level: Not on file  Occupational History  . Not on file  Tobacco Use  . Smoking status: Never Smoker  . Smokeless tobacco: Never Used  Vaping Use  . Vaping Use: Never used  Substance and Sexual Activity  . Alcohol use: Yes    Comment:  ocassional  . Drug use: Never  . Sexual activity: Not on file  Other Topics Concern  . Not on file  Social History Narrative  . Not on file   Social Determinants of Health   Financial Resource Strain: Not on file  Food Insecurity: Not on file  Transportation Needs: Not on file  Physical Activity: Not on file  Stress: Not on file  Social Connections: Not on file  Intimate Partner Violence: Not on file      Review of Systems  Constitutional: Negative for chills, fatigue and unexpected weight change.  HENT: Negative for congestion, rhinorrhea, sneezing and sore throat.   Eyes: Negative for redness.  Respiratory: Negative for cough, chest tightness and shortness of breath.   Cardiovascular: Negative for chest pain and palpitations.  Gastrointestinal: Negative for abdominal pain, constipation, diarrhea, nausea and vomiting.  Genitourinary: Negative for dysuria and frequency.  Musculoskeletal: Negative for arthralgias, back pain, joint swelling and neck pain.  Skin: Negative for rash.  Neurological: Negative.  Negative for tremors and numbness.  Hematological: Negative for adenopathy. Does not bruise/bleed easily.  Psychiatric/Behavioral: Negative for behavioral problems (Depression), sleep disturbance and suicidal ideas. The patient is not nervous/anxious.     Vital Signs: BP 128/88   Pulse 67   Temp 98.4 F (36.9 C)   Resp 16   Ht 5\' 8"  (1.727 m)   Wt 148 lb 9.6  oz (67.4 kg)   SpO2 97%   BMI 22.59 kg/m    Physical Exam Vitals reviewed.  Cardiovascular:     Rate and Rhythm: Normal rate and regular rhythm.     Pulses: Normal pulses.     Heart sounds: Normal heart sounds.  Pulmonary:     Effort: Pulmonary effort is normal.     Breath sounds: Normal breath sounds.  Skin:    General: Skin is warm and dry.     Capillary Refill: Capillary refill takes less than 2 seconds.  Neurological:     Mental Status: She is alert and oriented to person, place, and time.   Psychiatric:        Mood and Affect: Mood normal.        Behavior: Behavior normal.    Assessment/Plan: 1. Multinodular goiter Discussed thyroid ultrasound results. No changes  2. Encounter for colorectal cancer screening Patient is interested in trying the cologard stool DNA test.    General Counseling: Heather Wolf verbalizes understanding of the findings of todays visit and agrees with plan of treatment. I have discussed any further diagnostic evaluation that may be needed or ordered today. We also reviewed her medications today. she has been encouraged to call the office with any questions or concerns that should arise related to todays visit.    No orders of the defined types were placed in this encounter.   No orders of the defined types were placed in this encounter.   Return if symptoms worsen or fail to improve.   Total time spent:20 Minutes Time spent includes review of chart, medications, test results, and follow up plan with the patient.   [place] Controlled Substance Database was reviewed by me.  This patient was seen by Sallyanne Kuster, FNP-C in collaboration with Dr. Beverely Risen as a part of collaborative care agreement.   [place] Hawker R. Tedd Sias, MSN, FNP-C Internal medicine

## 2021-01-17 ENCOUNTER — Other Ambulatory Visit: Payer: Self-pay | Admitting: Internal Medicine

## 2021-01-17 DIAGNOSIS — F411 Generalized anxiety disorder: Secondary | ICD-10-CM

## 2021-01-17 DIAGNOSIS — N959 Unspecified menopausal and perimenopausal disorder: Secondary | ICD-10-CM

## 2021-03-26 IMAGING — MG DIGITAL SCREENING BILAT W/ TOMO W/ CAD
8 series · 9 of 24 positions shown · non-contrast
Comparison: Previous exam(s).

CLINICAL DATA: Screening.

EXAM:
DIGITAL SCREENING BILATERAL MAMMOGRAM WITH TOMO AND CAD

[R MLO synth-2D]
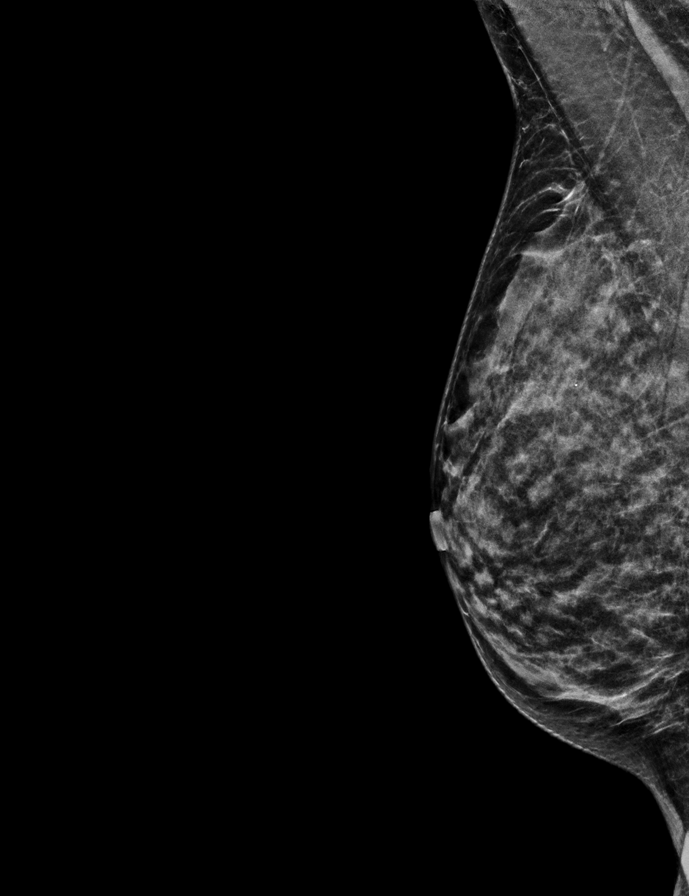

[L MLO synth-2D]
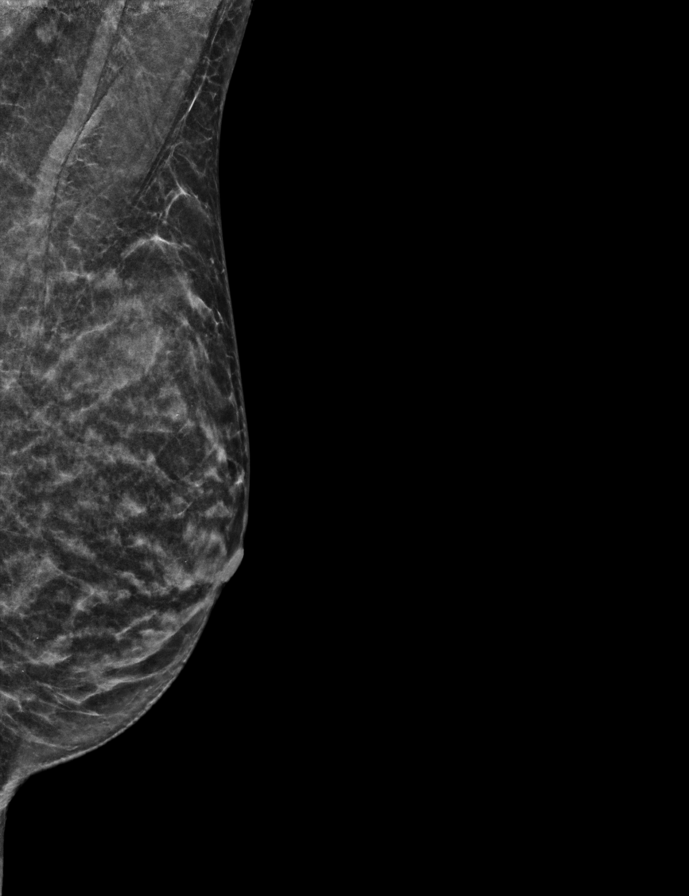

[L CC synth-2D]
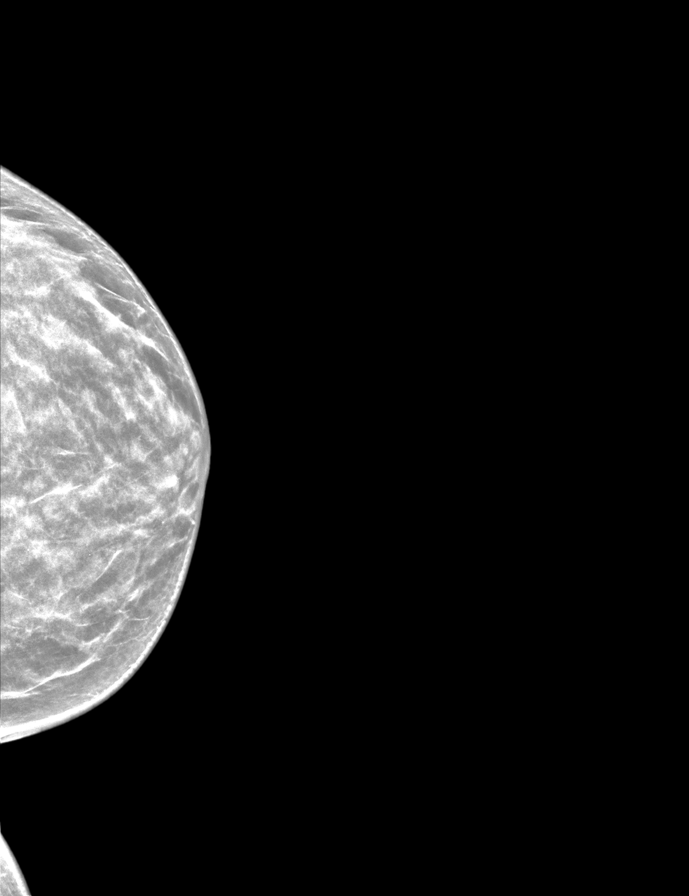

[R CC synth-2D]
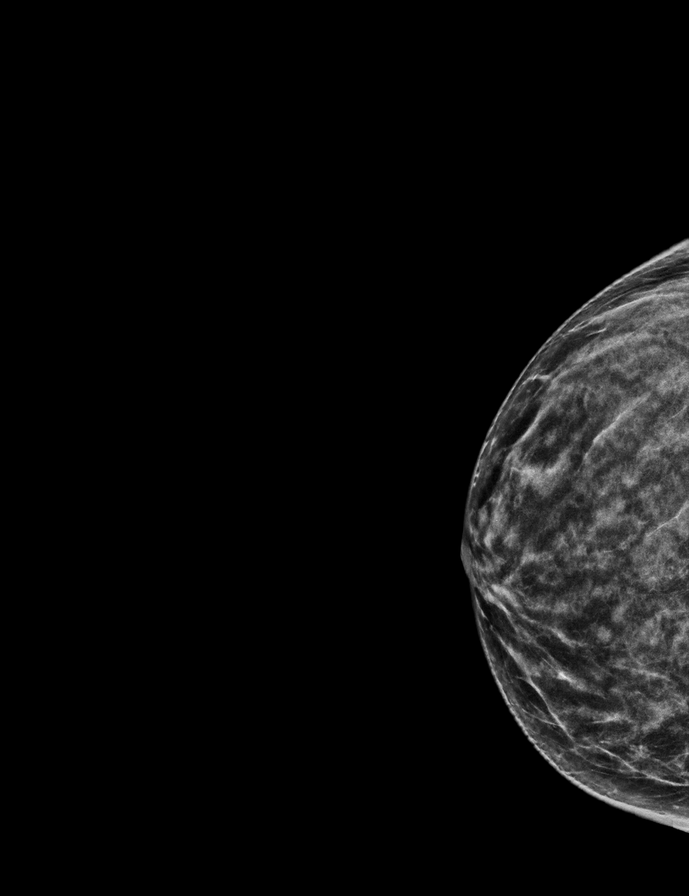

[R CC tomo · 2 of 48 frames shown]
[frame 16/48]
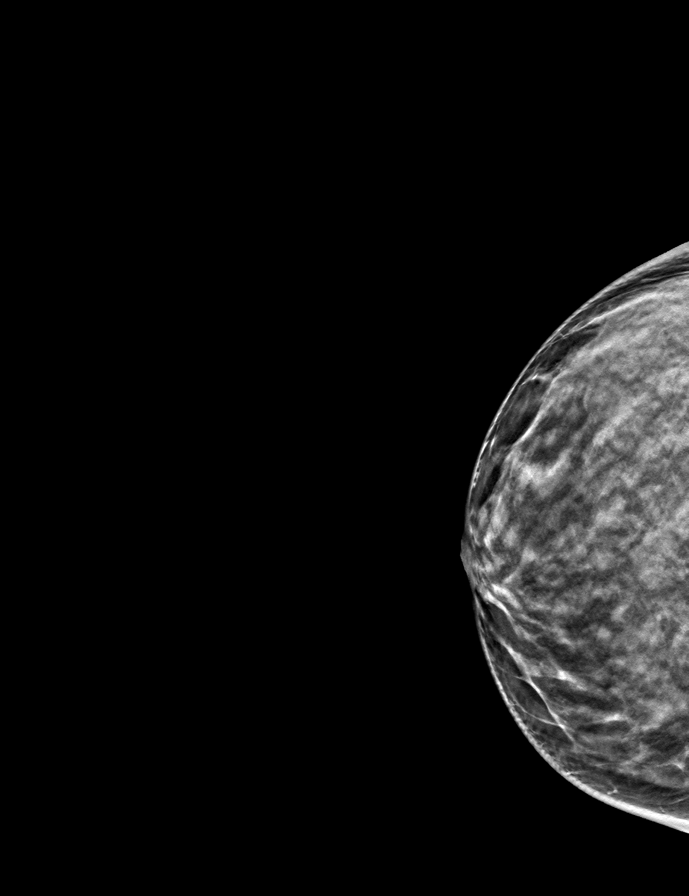
[frame 25/48]
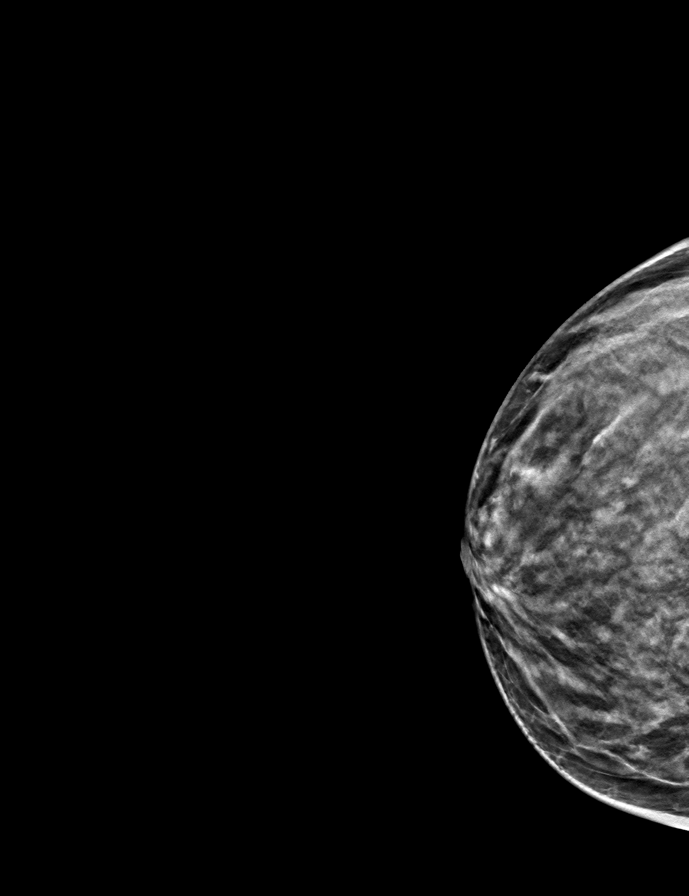

[L MLO tomo · tomo slice 23/46.0]
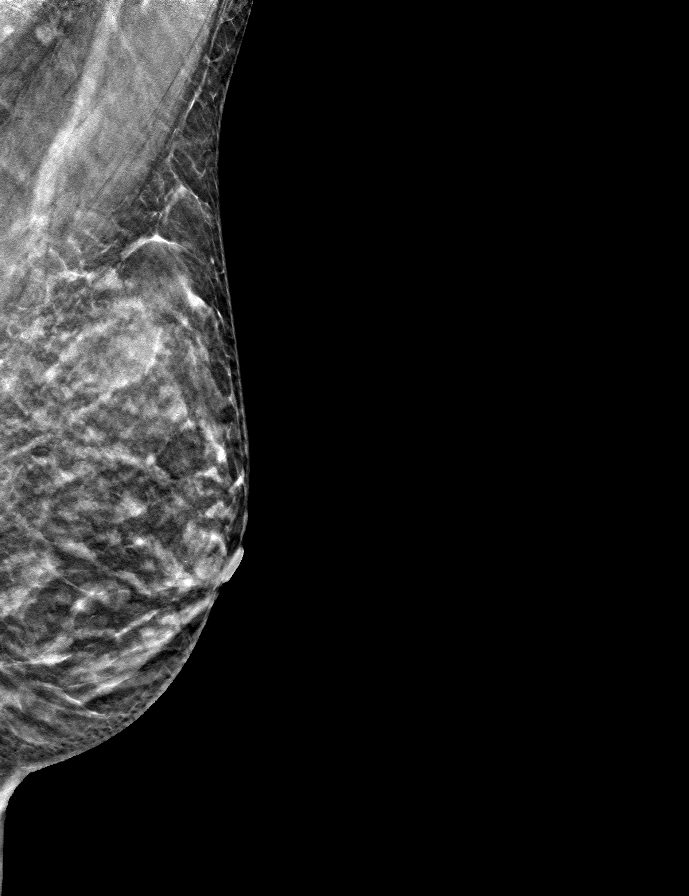

[R MLO tomo · tomo slice 20/39.0]
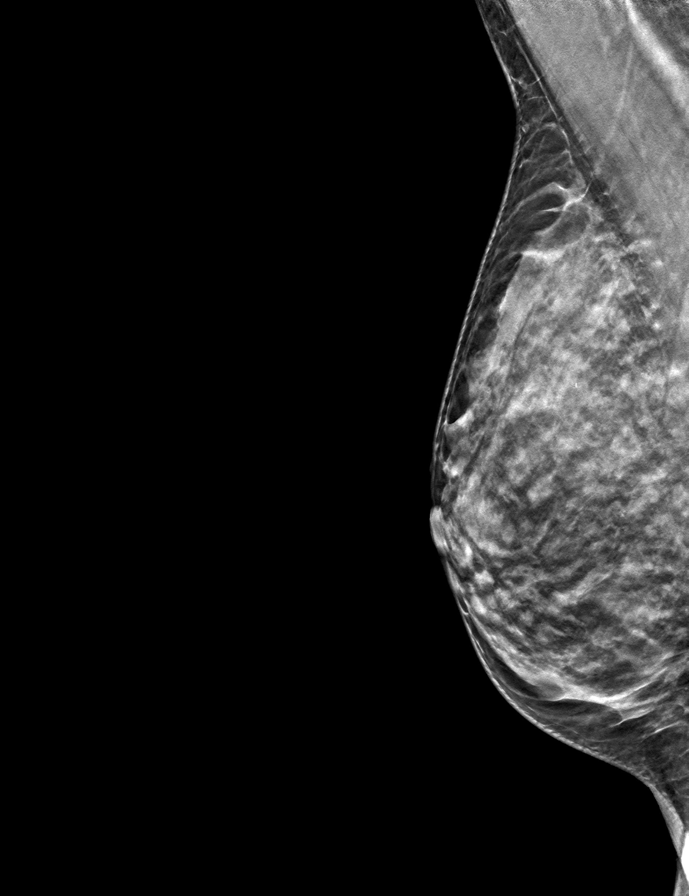

[L CC tomo · tomo slice 23/45.0]
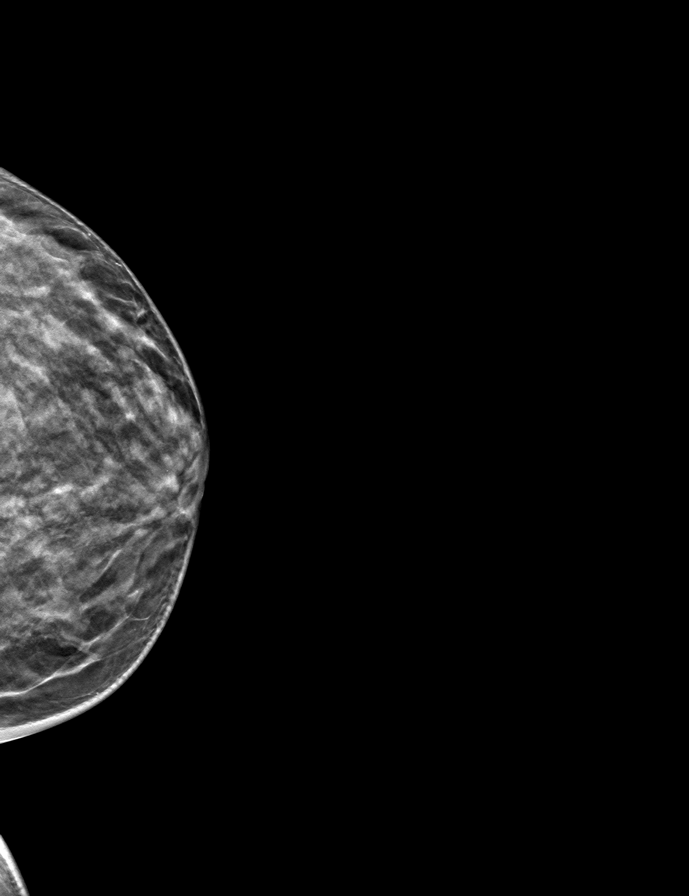

[9 of 24 positions shown; findings below may reference images not displayed]

ACR Breast Density Category c: The breast tissue is heterogeneously
dense, which may obscure small masses.
FINDINGS: There are no findings suspicious for malignancy. Images were
processed with CAD.
IMPRESSION: No mammographic evidence of malignancy. A result letter of this
screening mammogram will be mailed directly to the patient.

RECOMMENDATION:
Screening mammogram in one year. (Code:FT-U-LHB)

BI-RADS CATEGORY  1: Negative.

## 2021-06-19 DIAGNOSIS — L821 Other seborrheic keratosis: Secondary | ICD-10-CM | POA: Diagnosis not present

## 2021-06-19 DIAGNOSIS — L578 Other skin changes due to chronic exposure to nonionizing radiation: Secondary | ICD-10-CM | POA: Diagnosis not present

## 2021-06-19 DIAGNOSIS — D225 Melanocytic nevi of trunk: Secondary | ICD-10-CM | POA: Diagnosis not present

## 2021-07-14 ENCOUNTER — Encounter: Payer: BC Managed Care – PPO | Admitting: Internal Medicine

## 2021-08-04 ENCOUNTER — Other Ambulatory Visit: Payer: Self-pay | Admitting: Internal Medicine

## 2021-08-04 DIAGNOSIS — N959 Unspecified menopausal and perimenopausal disorder: Secondary | ICD-10-CM

## 2021-08-04 DIAGNOSIS — F411 Generalized anxiety disorder: Secondary | ICD-10-CM

## 2021-09-07 ENCOUNTER — Telehealth: Payer: Self-pay

## 2021-09-07 NOTE — Telephone Encounter (Signed)
Left vm to confirm 09/09/21 appointment-Toni °

## 2021-09-09 ENCOUNTER — Other Ambulatory Visit: Payer: Self-pay

## 2021-09-09 ENCOUNTER — Encounter: Payer: Self-pay | Admitting: Nurse Practitioner

## 2021-09-09 ENCOUNTER — Ambulatory Visit (INDEPENDENT_AMBULATORY_CARE_PROVIDER_SITE_OTHER): Payer: BC Managed Care – PPO | Admitting: Nurse Practitioner

## 2021-09-09 VITALS — BP 137/85 | HR 63 | Temp 98.5°F | Resp 16 | Ht 68.0 in | Wt 168.4 lb

## 2021-09-09 DIAGNOSIS — E042 Nontoxic multinodular goiter: Secondary | ICD-10-CM

## 2021-09-09 DIAGNOSIS — E782 Mixed hyperlipidemia: Secondary | ICD-10-CM | POA: Diagnosis not present

## 2021-09-09 DIAGNOSIS — E559 Vitamin D deficiency, unspecified: Secondary | ICD-10-CM

## 2021-09-09 DIAGNOSIS — R300 Dysuria: Secondary | ICD-10-CM | POA: Diagnosis not present

## 2021-09-09 DIAGNOSIS — Z0001 Encounter for general adult medical examination with abnormal findings: Secondary | ICD-10-CM | POA: Diagnosis not present

## 2021-09-09 DIAGNOSIS — N959 Unspecified menopausal and perimenopausal disorder: Secondary | ICD-10-CM | POA: Diagnosis not present

## 2021-09-09 DIAGNOSIS — Z23 Encounter for immunization: Secondary | ICD-10-CM

## 2021-09-09 DIAGNOSIS — F411 Generalized anxiety disorder: Secondary | ICD-10-CM

## 2021-09-09 DIAGNOSIS — Z1211 Encounter for screening for malignant neoplasm of colon: Secondary | ICD-10-CM

## 2021-09-09 DIAGNOSIS — Z1212 Encounter for screening for malignant neoplasm of rectum: Secondary | ICD-10-CM

## 2021-09-09 DIAGNOSIS — Z1231 Encounter for screening mammogram for malignant neoplasm of breast: Secondary | ICD-10-CM

## 2021-09-09 MED ORDER — SERTRALINE HCL 50 MG PO TABS: 50.0000 mg | ORAL_TABLET | Freq: Every day | ORAL | 3 refills | Status: DC

## 2021-09-09 MED ORDER — ZOSTER VAC RECOMB ADJUVANTED 50 MCG/0.5ML IM SUSR: 0.5000 mL | Freq: Once | INTRAMUSCULAR | 0 refills | Status: AC

## 2021-09-09 MED ORDER — TETANUS-DIPHTH-ACELL PERTUSSIS 5-2.5-18.5 LF-MCG/0.5 IM SUSP: 0.5000 mL | Freq: Once | INTRAMUSCULAR | 0 refills | Status: AC

## 2021-09-09 MED ORDER — ESTRADIOL 1 MG PO TABS: 1.0000 mg | ORAL_TABLET | Freq: Every day | ORAL | 3 refills | Status: DC

## 2021-09-09 NOTE — Progress Notes (Signed)
[facility] [place], [place] [address]  Internal MEDICINE  Office Visit Note  Patient Name: Heather Wolf  [id number]  [id number]  Date of Service: 09/09/2021  Chief Complaint  Patient presents with   Annual Exam   Depression   Quality Metric Gaps    Needs Mammogram and possibly Colonoscopy   Medication Problem    Pt would like to know how long she needs to be taking hormones for - has had a total hysterectomy    HPI Heather Wolf presents for an annual well visit and physical exam.  She is a well-appearing 51 year old female with Raynaud's phenomenon, multinodular goiter and generalized anxiety disorder.  She has also been on hormone replacement therapy for several years since she had a total hysterectomy and is wondering how long she needs to stay on them.  She is due for a mammogram and colonoscopy..  Instead of a colonoscopy, she is interested in trying the Cologuard stool test.  She is due for routine labs and the Tdap booster and shingles vaccine She is also due for medication refills.  She denies any pain and has no other concerns today.    Current Medication: Outpatient Encounter Medications as of 09/09/2021  Medication Sig   nitrofurantoin, macrocrystal-monohydrate, (MACROBID) 100 MG capsule Take 1 capsule (100 mg total) by mouth 2 (two) times daily.   [EXPIRED] Tdap (BOOSTRIX) 5-2.5-18.5 LF-MCG/0.5 injection Inject 0.5 mLs into the muscle once for 1 dose.   [EXPIRED] Zoster Vaccine Adjuvanted [facility]) injection Inject 0.5 mLs into the muscle once for 1 dose.   [DISCONTINUED] estradiol (ESTRACE) 1 MG tablet TAKE 1 TABLET BY MOUTH EVERY DAY   [DISCONTINUED] sertraline (ZOLOFT) 50 MG tablet TAKE 1 TABLET BY MOUTH EVERY DAY   estradiol (ESTRACE) 1 MG tablet Take 1 tablet (1 mg total) by mouth daily.   sertraline (ZOLOFT) 50 MG tablet Take 1 tablet (50 mg total) by mouth daily.   No facility-administered encounter medications on file as of 09/09/2021.     Surgical History: Past Surgical History:  Procedure Laterality Date   ABDOMINAL HYSTERECTOMY      Medical History: Past Medical History:  Diagnosis Date   Depression     Family History: Family History  Problem Relation Age of Onset   Hypertension Mother    Heart attack Father    Breast cancer Neg Hx     Social History   Socioeconomic History   Marital status: Married    Spouse name: Not on file   Number of children: Not on file   Years of education: Not on file   Highest education level: Not on file  Occupational History   Not on file  Tobacco Use   Smoking status: Never   Smokeless tobacco: Never  Vaping Use   Vaping Use: Never used  Substance and Sexual Activity   Alcohol use: Yes    Comment: ocassional   Drug use: Never   Sexual activity: Not on file  Other Topics Concern   Not on file  Social History Narrative   Not on file   Social Determinants of Health   Financial Resource Strain: Not on file  Food Insecurity: Not on file  Transportation Needs: Not on file  Physical Activity: Not on file  Stress: Not on file  Social Connections: Not on file  Intimate Partner Violence: Not on file      Review of Systems  Constitutional:  Negative for activity change, appetite change, chills, fatigue, fever and  unexpected weight change.  HENT: Negative.  Negative for congestion, ear pain, rhinorrhea, sore throat and trouble swallowing.   Eyes: Negative.   Respiratory: Negative.  Negative for cough, chest tightness, shortness of breath and wheezing.   Cardiovascular: Negative.  Negative for chest pain.  Gastrointestinal: Negative.  Negative for abdominal pain, blood in stool, constipation, diarrhea, nausea and vomiting.  Endocrine: Negative.   Genitourinary: Negative.  Negative for difficulty urinating, dysuria, frequency, hematuria and urgency.  Musculoskeletal: Negative.  Negative for arthralgias, back pain, joint swelling, myalgias and neck pain.  Skin:  Negative.  Negative for rash and wound.  Allergic/Immunologic: Negative.  Negative for immunocompromised state.  Neurological: Negative.  Negative for dizziness, seizures, numbness and headaches.  Hematological: Negative.   Psychiatric/Behavioral:  Positive for depression. Negative for behavioral problems, self-injury and suicidal ideas. The patient is not nervous/anxious.    Vital Signs: BP 137/85    Pulse 63    Temp 98.5 F (36.9 C)    Resp 16    Ht 5' 8"  (1.727 m)    Wt 168 lb 6.4 oz (76.4 kg)    SpO2 99%    BMI 25.61 kg/m    Physical Exam Vitals reviewed.  Constitutional:      General: She is not in acute distress.    Appearance: Normal appearance. She is well-developed and normal weight. She is not ill-appearing or diaphoretic.  HENT:     Head: Normocephalic and atraumatic.     Right Ear: Tympanic membrane, ear canal and external ear normal.     Left Ear: Tympanic membrane, ear canal and external ear normal.     Nose: Nose normal. No congestion or rhinorrhea.     Mouth/Throat:     Mouth: Mucous membranes are moist.     Pharynx: Oropharynx is clear. No oropharyngeal exudate or posterior oropharyngeal erythema.  Eyes:     General: No scleral icterus.       Right eye: No discharge.        Left eye: No discharge.     Extraocular Movements: Extraocular movements intact.     Conjunctiva/sclera: Conjunctivae normal.     Pupils: Pupils are equal, round, and reactive to light.  Neck:     Thyroid: No thyromegaly.     Vascular: No JVD.     Trachea: No tracheal deviation.  Cardiovascular:     Rate and Rhythm: Normal rate and regular rhythm.     Pulses: Normal pulses.     Heart sounds: Normal heart sounds. No murmur heard.   No friction rub. No gallop.  Pulmonary:     Effort: Pulmonary effort is normal. No respiratory distress.     Breath sounds: Normal breath sounds. No stridor. No wheezing or rales.  Chest:     Chest wall: No tenderness.  Abdominal:     General: Bowel sounds  are normal. There is no distension.     Palpations: Abdomen is soft. There is no mass.     Tenderness: There is no abdominal tenderness. There is no guarding or rebound.  Musculoskeletal:        General: No tenderness or deformity. Normal range of motion.     Cervical back: Normal range of motion and neck supple.  Lymphadenopathy:     Cervical: No cervical adenopathy.  Skin:    General: Skin is warm and dry.     Capillary Refill: Capillary refill takes less than 2 seconds.     Coloration: Skin is not pale.  Findings: No erythema or rash.  Neurological:     Mental Status: She is alert and oriented to person, place, and time.     Cranial Nerves: No cranial nerve deficit.     Motor: No abnormal muscle tone.     Coordination: Coordination normal.     Deep Tendon Reflexes: Reflexes are normal and symmetric.  Psychiatric:        Mood and Affect: Mood normal.        Behavior: Behavior normal.        Thought Content: Thought content normal.        Judgment: Judgment normal.      Assessment and Plan: 1. Encounter for general adult medical examination with abnormal findings Age-appropriate preventive screenings and vaccinations discussed, annual physical exam completed. Routine labs for health maintenance ordered, see below. PHM updated.  - Cologuard - MM 3D SCREEN BREAST BILATERAL; Future - CBC with Differential/Platelet - CMP14+EGFR - Lipid Profile - TSH + free T4 - Vitamin D (25 hydroxy)  2. Unspecified menopausal and perimenopausal disorder Patient has been on hormone replacement therapy with estradiol 1 mg tablet for at least 10 years.  Recommended timeframe of hormone replacement therapy is generally 5 years.  Discussed plan of gradual tapering off of oral estradiol and patient is agreeable.  Tapering regimen will consist of decreasing 1 day a week of taking the dose of estradiol every 4 weeks until she is no longer taking the medication.  This will approximately take 7 months  but it could take longer if the patient does not tolerate the decreased dose and has to titrate back up on the dose. - estradiol (ESTRACE) 1 MG tablet; Take 1 tablet (1 mg total) by mouth daily.  Dispense: 90 tablet; Refill: 3  3. Vitamin D deficiency Routine lab ordered - Vitamin D (25 hydroxy)  4. Multinodular goiter Routine labs ordered - CBC with Differential/Platelet - CMP14+EGFR - TSH + free T4  5. Mixed hyperlipidemia Routine lab ordered - CBC with Differential/Platelet - CMP14+EGFR - Lipid Profile  6. Dysuria Routine urinalysis done - UA/M w/rflx Culture, Routine - Microscopic Examination  7. Encounter for screening mammogram for malignant neoplasm of breast Routine mammogram ordered - MM 3D SCREEN BREAST BILATERAL; Future  8. Screening for colorectal cancer Cologuard stool test ordered - Cologuard  9. Generalized anxiety disorder Stable, continue sertraline as prescribed, refills ordered. - sertraline (ZOLOFT) 50 MG tablet; Take 1 tablet (50 mg total) by mouth daily.  Dispense: 90 tablet; Refill: 3  10. Need for vaccination - Zoster Vaccine Adjuvanted [facility]) injection; Inject 0.5 mLs into the muscle once for 1 dose.  Dispense: 0.5 mL; Refill: 0 - Tdap (BOOSTRIX) 5-2.5-18.5 LF-MCG/0.5 injection; Inject 0.5 mLs into the muscle once for 1 dose.  Dispense: 0.5 mL; Refill: 0      General Counseling: Miaa verbalizes understanding of the findings of todays visit and agrees with plan of treatment. I have discussed any further diagnostic evaluation that may be needed or ordered today. We also reviewed her medications today. she has been encouraged to call the office with any questions or concerns that should arise related to todays visit.    Orders Placed This Encounter  Procedures   Microscopic Examination   MM 3D SCREEN BREAST BILATERAL   UA/M w/rflx Culture, Routine   Cologuard   CBC with Differential/Platelet   CMP14+EGFR   Lipid Profile   TSH +  free T4   Vitamin D (25 hydroxy)    Meds ordered  this encounter  Medications   Zoster Vaccine Adjuvanted [facility]) injection    Sig: Inject 0.5 mLs into the muscle once for 1 dose.    Dispense:  0.5 mL    Refill:  0   Tdap (BOOSTRIX) 5-2.5-18.5 LF-MCG/0.5 injection    Sig: Inject 0.5 mLs into the muscle once for 1 dose.    Dispense:  0.5 mL    Refill:  0   sertraline (ZOLOFT) 50 MG tablet    Sig: Take 1 tablet (50 mg total) by mouth daily.    Dispense:  90 tablet    Refill:  3   estradiol (ESTRACE) 1 MG tablet    Sig: Take 1 tablet (1 mg total) by mouth daily.    Dispense:  90 tablet    Refill:  3    Return in about 6 months (around 03/09/2022) for F/U, Kyren Knick PCP tapering off estrace.   Total time spent:30 Minutes Time spent includes review of chart, medications, test results, and follow up plan with the patient.   [place] Controlled Substance Database was reviewed by me.  This patient was seen by Jonetta Osgood, FNP-C in collaboration with Dr. Clayborn Bigness as a part of collaborative care agreement.  Heylee Tant R. Valetta Fuller, MSN, FNP-C Internal medicine

## 2021-09-10 LAB — UA/M W/RFLX CULTURE, ROUTINE
Bilirubin, UA: NEGATIVE
Glucose, UA: NEGATIVE
Ketones, UA: NEGATIVE
Leukocytes,UA: NEGATIVE
Nitrite, UA: NEGATIVE
Protein,UA: NEGATIVE
RBC, UA: NEGATIVE
Specific Gravity, UA: 1.016 (ref 1.005–1.030)
Urobilinogen, Ur: 0.2 mg/dL (ref 0.2–1.0)
pH, UA: 6.5 (ref 5.0–7.5)

## 2021-09-10 LAB — MICROSCOPIC EXAMINATION: Casts: NONE SEEN /lpf

## 2021-09-17 DIAGNOSIS — E782 Mixed hyperlipidemia: Secondary | ICD-10-CM | POA: Diagnosis not present

## 2021-09-17 DIAGNOSIS — Z1211 Encounter for screening for malignant neoplasm of colon: Secondary | ICD-10-CM | POA: Diagnosis not present

## 2021-09-17 DIAGNOSIS — E042 Nontoxic multinodular goiter: Secondary | ICD-10-CM | POA: Diagnosis not present

## 2021-09-17 DIAGNOSIS — Z1212 Encounter for screening for malignant neoplasm of rectum: Secondary | ICD-10-CM | POA: Diagnosis not present

## 2021-09-17 DIAGNOSIS — R300 Dysuria: Secondary | ICD-10-CM | POA: Diagnosis not present

## 2021-09-17 DIAGNOSIS — E559 Vitamin D deficiency, unspecified: Secondary | ICD-10-CM | POA: Diagnosis not present

## 2021-09-17 DIAGNOSIS — F411 Generalized anxiety disorder: Secondary | ICD-10-CM | POA: Diagnosis not present

## 2021-09-18 LAB — LIPID PANEL
Chol/HDL Ratio: 3.2 ratio (ref 0.0–4.4)
Cholesterol, Total: 273 mg/dL — ABNORMAL HIGH (ref 100–199)
HDL: 86 mg/dL (ref >39)
LDL Chol Calc (NIH): 176 mg/dL — ABNORMAL HIGH (ref 0–99)
Triglycerides: 70 mg/dL (ref 0–149)
VLDL Cholesterol Cal: 11 mg/dL (ref 5–40)

## 2021-09-18 LAB — CMP14+EGFR
ALT: 13 IU/L (ref 0–32)
AST: 13 IU/L (ref 0–40)
Albumin/Globulin Ratio: 2.1 (ref 1.2–2.2)
Albumin: 4.6 g/dL (ref 3.8–4.8)
Alkaline Phosphatase: 68 IU/L (ref 44–121)
BUN/Creatinine Ratio: 18 (ref 9–23)
BUN: 13 mg/dL (ref 6–24)
Bilirubin Total: 0.2 mg/dL (ref 0.0–1.2)
CO2: 27 mmol/L (ref 20–29)
Calcium: 10.1 mg/dL (ref 8.7–10.2)
Chloride: 100 mmol/L (ref 96–106)
Creatinine, Ser: 0.74 mg/dL (ref 0.57–1.00)
Globulin, Total: 2.2 g/dL (ref 1.5–4.5)
Glucose: 79 mg/dL (ref 70–99)
Potassium: 4.2 mmol/L (ref 3.5–5.2)
Sodium: 144 mmol/L (ref 134–144)
Total Protein: 6.8 g/dL (ref 6.0–8.5)
eGFR: 99 mL/min/{1.73_m2} (ref >59)

## 2021-09-18 LAB — CBC WITH DIFFERENTIAL/PLATELET
Basophils Absolute: 0 10*3/uL (ref 0.0–0.2)
Basos: 1 %
EOS (ABSOLUTE): 0 10*3/uL (ref 0.0–0.4)
Eos: 1 %
Hematocrit: 42.4 % (ref 34.0–46.6)
Hemoglobin: 14 g/dL (ref 11.1–15.9)
Immature Grans (Abs): 0 10*3/uL (ref 0.0–0.1)
Immature Granulocytes: 0 %
Lymphocytes Absolute: 1 10*3/uL (ref 0.7–3.1)
Lymphs: 23 %
MCH: 29.6 pg (ref 26.6–33.0)
MCHC: 33 g/dL (ref 31.5–35.7)
MCV: 90 fL (ref 79–97)
Monocytes Absolute: 0.3 10*3/uL (ref 0.1–0.9)
Monocytes: 6 %
Neutrophils Absolute: 2.9 10*3/uL (ref 1.4–7.0)
Neutrophils: 69 %
Platelets: 245 10*3/uL (ref 150–450)
RBC: 4.73 x10E6/uL (ref 3.77–5.28)
RDW: 12.1 % (ref 11.7–15.4)
WBC: 4.2 10*3/uL (ref 3.4–10.8)

## 2021-09-18 LAB — TSH+FREE T4
Free T4: 1.15 ng/dL (ref 0.82–1.77)
TSH: 1.14 u[IU]/mL (ref 0.450–4.500)

## 2021-09-18 LAB — VITAMIN D 25 HYDROXY (VIT D DEFICIENCY, FRACTURES): Vit D, 25-Hydroxy: 37.6 ng/mL (ref 30.0–100.0)

## 2021-09-18 NOTE — Progress Notes (Signed)
Please call the patient and let her know her results:  -CBC and metabolic panel are normal. No anemia noted. Liver and kidney function are normal.  -Thyroid levels are normal -vitamin D is low normal, continue an OTC supplement, 5000 units daily.  -cholesterol levels are significantly elevated. Total cholesterol is 273 and LDL is 176. But your risk of ASCVD (atherosclerotic cardiovascular disease) is low at 1.2%. please consider the following recommendations to help improve your cholesterol levels: limit red meat intake, increase lean protein in diet and add an OTC fish oil supplement of 1000 mg daily.

## 2021-09-21 ENCOUNTER — Telehealth: Payer: Self-pay

## 2021-09-21 NOTE — Telephone Encounter (Signed)
-----   Message from Sallyanne Kuster, NP sent at 09/18/2021  3:25 PM EST ----- Please call the patient and let her know her results:  -CBC and metabolic panel are normal. No anemia noted. Liver and kidney function are normal.  -Thyroid levels are normal -vitamin D is low normal, continue an OTC supplement, 5000 units daily.  -cholesterol levels are significantly elevated. Total cholesterol is 273 and LDL is 176. But your risk of ASCVD (atherosclerotic cardiovascular disease) is low at 1.2%. please consider the following recommendations to help improve your cholesterol levels: limit red meat intake, increase lean protein in diet and add an OTC fish oil supplement of 1000 mg daily.

## 2021-09-25 LAB — COLOGUARD: COLOGUARD: NEGATIVE

## 2021-09-28 NOTE — Progress Notes (Signed)
Spoke to pt and provided results

## 2021-10-02 ENCOUNTER — Encounter: Payer: Self-pay | Admitting: Nurse Practitioner

## 2021-10-21 ENCOUNTER — Ambulatory Visit: Payer: BC Managed Care – PPO | Attending: Nurse Practitioner

## 2021-10-22 ENCOUNTER — Other Ambulatory Visit: Payer: Self-pay

## 2021-10-22 ENCOUNTER — Ambulatory Visit
Admission: RE | Admit: 2021-10-22 | Discharge: 2021-10-22 | Disposition: A | Discharge status: Home / Self Care | Payer: BC Managed Care – PPO | Source: Ambulatory Visit | Attending: Nurse Practitioner | Admitting: Nurse Practitioner

## 2021-10-22 DIAGNOSIS — Z0001 Encounter for general adult medical examination with abnormal findings: Secondary | ICD-10-CM | POA: Diagnosis not present

## 2021-10-22 DIAGNOSIS — Z1231 Encounter for screening mammogram for malignant neoplasm of breast: Secondary | ICD-10-CM | POA: Diagnosis not present

## 2021-12-09 ENCOUNTER — Ambulatory Visit
Admission: RE | Admit: 2021-12-09 | Discharge: 2021-12-09 | Disposition: A | Discharge status: Home / Self Care | Payer: BC Managed Care – PPO | Source: Ambulatory Visit | Attending: Family Medicine | Admitting: Family Medicine

## 2021-12-09 VITALS — BP 143/80 | HR 96 | Temp 98.9°F | Resp 18

## 2021-12-09 DIAGNOSIS — J0100 Acute maxillary sinusitis, unspecified: Secondary | ICD-10-CM | POA: Diagnosis not present

## 2021-12-09 DIAGNOSIS — R059 Cough, unspecified: Secondary | ICD-10-CM

## 2021-12-09 MED ORDER — AMOXICILLIN-POT CLAVULANATE 875-125 MG PO TABS: 1.0000 | ORAL_TABLET | Freq: Two times a day (BID) | ORAL | 0 refills | Status: DC

## 2021-12-09 MED ORDER — PROMETHAZINE-DM 6.25-15 MG/5ML PO SYRP: 5.0000 mL | ORAL_SOLUTION | Freq: Three times a day (TID) | ORAL | 0 refills | Status: DC | PRN

## 2021-12-09 NOTE — ED Triage Notes (Signed)
Pt here with nasal congestion, facial/sinus pressure, and ear fullness x 4 days.  ?

## 2021-12-09 NOTE — ED Provider Notes (Signed)
?[facility] ? ? ? ?CSN: [id number] ?Arrival date & time: 12/09/21  1301 ? ? ?  ? ?History   ?Chief Complaint ?Chief Complaint  ?Patient presents with  ? Nasal Congestion  ? Headache  ? Facial Pressure  ? Ear Fullness  ? ? ?HPI ?Heather Wolf is a 51 y.o. female.  ? ?HPI ?Patient presents for evaluation URI symptoms of nasal congestion, headache, facial pressure, ear fullness, without fever x 5 days. She reports the headache pain and cough have worsened . Endorses mild shortness of breath. No history of asthma. She has taken Mucinex and takes Claritin daily without any relief.  ? ?Past Medical History:  ?Diagnosis Date  ? Depression   ? ? ?Patient Active Problem List  ? Diagnosis Date Noted  ? Hyperlipidemia LDL goal <100 06/17/2019  ? Encounter for general adult medical examination with abnormal findings 05/11/2019  ? Other fatigue 05/11/2019  ? Raynaud's disease without gangrene 05/11/2019  ? Multinodular goiter 05/11/2019  ? Screening for breast cancer 05/08/2018  ? Unspecified menopausal and perimenopausal disorder 05/08/2018  ? Generalized anxiety disorder 05/08/2018  ? Vitamin D deficiency 05/08/2018  ? Dysuria 05/08/2018  ? ? ?Past Surgical History:  ?Procedure Laterality Date  ? ABDOMINAL HYSTERECTOMY    ? ? ?OB History   ?No obstetric history on file. ?  ? ? ? ?Home Medications   ? ?Prior to Admission medications   ?Medication Sig Start Date End Date Taking? Authorizing Provider  ?amoxicillin-clavulanate (AUGMENTIN) 875-125 MG tablet Take 1 tablet by mouth 2 (two) times daily. 12/09/21  Yes Bing Neighbors, FNP  ?promethazine-dextromethorphan (PROMETHAZINE-DM) 6.25-15 MG/5ML syrup Take 5 mLs by mouth 3 (three) times daily as needed for cough. 12/09/21  Yes Bing Neighbors, FNP  ?estradiol (ESTRACE) 1 MG tablet Take 1 tablet (1 mg total) by mouth daily. 09/09/21   Sallyanne Kuster, NP  ?nitrofurantoin, macrocrystal-monohydrate, (MACROBID) 100 MG capsule Take 1 capsule (100 mg total) by mouth 2  (two) times daily. 07/28/20   Carlean Jews, NP  ?sertraline (ZOLOFT) 50 MG tablet Take 1 tablet (50 mg total) by mouth daily. 09/09/21   Sallyanne Kuster, NP  ? ? ?Family History ?Family History  ?Problem Relation Age of Onset  ? Hypertension Mother   ? Heart attack Father   ? Breast cancer Neg Hx   ? ? ?Social History ?Social History  ? ?Tobacco Use  ? Smoking status: Never  ? Smokeless tobacco: Never  ?Vaping Use  ? Vaping Use: Never used  ?Substance Use Topics  ? Alcohol use: Yes  ?  Comment: ocassional  ? Drug use: Never  ? ? ? ?Allergies   ?Patient has no known allergies. ? ? ?Review of Systems ?Review of Systems ?Pertinent negatives listed in HPI  ?Physical Exam ?Triage Vital Signs ?ED Triage Vitals [12/09/21 1317]  ?Enc Vitals Group  ?   BP (!) 143/80  ?   Pulse Rate 96  ?   Resp 18  ?   Temp 98.9 ?F (37.2 ?C)  ?   Temp Source Oral  ?   SpO2 95 %  ?   Weight   ?   Height   ?   Head Circumference   ?   Peak Flow   ?   Pain Score   ?   Pain Loc   ?   Pain Edu?   ?   Excl. in GC?   ? ?No data found. ? ?Updated Vital Signs ?BP Marland Kitchen)  143/80 (BP Location: Left Arm)   Pulse 96   Temp 98.9 ?F (37.2 ?C) (Oral)   Resp 18   SpO2 95%  ? ?Visual Acuity ?Right Eye Distance:   ?Left Eye Distance:   ?Bilateral Distance:   ? ?Right Eye Near:   ?Left Eye Near:    ?Bilateral Near:    ? ?Physical Exam ? ?General Appearance:    Alert, acutely ill appearing, cooperative, no distress  ?HENT:   Normocephalic, ears normal, nares mucosal edema with congestion, rhinorrhea, oropharynx    ?Eyes:    PERRL, conjunctiva/corneas clear, EOM's intact       ?Lungs:     Clear to auscultation bilaterally, respirations unlabored  ?Heart:    Regular rate and rhythm  ?Neurologic:   Awake, alert, oriented x 3. No apparent focal neurological           defect.   ?  ? ?UC Treatments / Results  ?Labs ?(all labs ordered are listed, but only abnormal results are displayed) ?Labs Reviewed - No data to display ? ?EKG ? ? ?Radiology ?No results  found. ? ?Procedures ?Procedures (including critical care time) ? ?Medications Ordered in UC ?Medications - No data to display ? ?Initial Impression / Assessment and Plan / UC Course  ?I have reviewed the triage vital signs and the nursing notes. ? ?Pertinent labs & imaging results that were available during my care of the patient were reviewed by me and considered in my medical decision making (see chart for details). ? ?  ?Acute non-recurrent sinusitis with cough ?Treatment per discharge medication orders. ?Continue daily antihistamine therapy. ?Return as needed. ?Final Clinical Impressions(s) / UC Diagnoses  ? ?Final diagnoses:  ?Acute non-recurrent maxillary sinusitis  ?Cough in adult  ? ?Discharge Instructions   ?None ?  ? ?ED Prescriptions   ? ? Medication Sig Dispense Auth. Provider  ? promethazine-dextromethorphan (PROMETHAZINE-DM) 6.25-15 MG/5ML syrup Take 5 mLs by mouth 3 (three) times daily as needed for cough. 180 mL Bing Neighbors, FNP  ? amoxicillin-clavulanate (AUGMENTIN) 875-125 MG tablet Take 1 tablet by mouth 2 (two) times daily. 20 tablet Bing Neighbors, FNP  ? ?  ? ?PDMP not reviewed this encounter. ?  ?Bing Neighbors, FNP ?12/09/21 1513 ? ?

## 2022-02-01 ENCOUNTER — Ambulatory Visit
Admission: RE | Admit: 2022-02-01 | Discharge: 2022-02-01 | Disposition: A | Discharge status: Home / Self Care | Payer: BC Managed Care – PPO | Source: Ambulatory Visit | Attending: Emergency Medicine | Admitting: Emergency Medicine

## 2022-02-01 ENCOUNTER — Ambulatory Visit (INDEPENDENT_AMBULATORY_CARE_PROVIDER_SITE_OTHER): Payer: BC Managed Care – PPO

## 2022-02-01 VITALS — BP 120/75 | HR 73 | Temp 97.7°F | Resp 18

## 2022-02-01 DIAGNOSIS — M79661 Pain in right lower leg: Secondary | ICD-10-CM | POA: Diagnosis not present

## 2022-02-01 DIAGNOSIS — M7989 Other specified soft tissue disorders: Secondary | ICD-10-CM

## 2022-02-01 DIAGNOSIS — M79604 Pain in right leg: Secondary | ICD-10-CM | POA: Diagnosis not present

## 2022-02-01 DIAGNOSIS — M25561 Pain in right knee: Secondary | ICD-10-CM | POA: Diagnosis not present

## 2022-02-01 DIAGNOSIS — S8011XA Contusion of right lower leg, initial encounter: Secondary | ICD-10-CM | POA: Diagnosis not present

## 2022-02-01 DIAGNOSIS — S8991XA Unspecified injury of right lower leg, initial encounter: Secondary | ICD-10-CM | POA: Diagnosis not present

## 2022-02-01 NOTE — ED Triage Notes (Addendum)
Pt presents with complaints of hitting her right shin x 2 weeks ago in her bed room. Reports having swelling and bruising in her right foot. Pt has a knot on her right shin. Pt just got back from vacation where she was in the car for 4 hours.  Reports right leg feels heavy today.

## 2022-03-10 ENCOUNTER — Encounter: Payer: Self-pay | Admitting: Nurse Practitioner

## 2022-03-10 ENCOUNTER — Ambulatory Visit (INDEPENDENT_AMBULATORY_CARE_PROVIDER_SITE_OTHER): Payer: BC Managed Care – PPO | Admitting: Nurse Practitioner

## 2022-03-10 VITALS — BP 124/63 | HR 86 | Temp 98.5°F | Resp 16 | Ht 68.0 in | Wt 162.4 lb

## 2022-03-10 DIAGNOSIS — N959 Unspecified menopausal and perimenopausal disorder: Secondary | ICD-10-CM

## 2022-03-10 DIAGNOSIS — Z23 Encounter for immunization: Secondary | ICD-10-CM

## 2022-03-10 DIAGNOSIS — E782 Mixed hyperlipidemia: Secondary | ICD-10-CM

## 2022-03-10 MED ORDER — TETANUS-DIPHTH-ACELL PERTUSSIS 5-2.5-18.5 LF-MCG/0.5 IM SUSP: 0.5000 mL | Freq: Once | INTRAMUSCULAR | 0 refills | Status: AC

## 2022-03-10 MED ORDER — ZOSTER VAC RECOMB ADJUVANTED 50 MCG/0.5ML IM SUSR: 0.5000 mL | Freq: Once | INTRAMUSCULAR | 0 refills | Status: AC

## 2022-03-10 NOTE — Progress Notes (Signed)
[facility] [address] [place], [place] [address]  Internal MEDICINE  Office Visit Note  Patient Name: Heather Wolf  [id number]  [id number]  Date of Service: 03/10/2022  Chief Complaint  Patient presents with   Follow-up   Depression    HPI Heather Wolf presents for a follow-up visit for depression, hyperlipidemia and HRT.  At her previous office visit we had discussed tapering off of the Estrace tablet which she was initially going to do but then after doing further research and talking to some friends/family who worked in healthcare she decided to stay on the Estrace but to taper off of her Zoloft.  She stopped taking the Zoloft completely about 2 months ago and reports that she has been feeling much better.  She denies any depressive symptoms.  She is now with a more natural approach and is taking 5 HTP 200+ which is an over-the-counter supplement and she takes it at night.  She wants to see how her cholesterol levels are looking now and to also determine if she will need cholesterol-lowering medication or not.    Current Medication: Outpatient Encounter Medications as of 03/10/2022  Medication Sig   5-Hydroxytryptophan (5-HTP) 200 MG TABS Take 1 tablet by mouth at bedtime.   estradiol (ESTRACE) 1 MG tablet Take 1 tablet (1 mg total) by mouth daily.   [DISCONTINUED] Tdap (BOOSTRIX) 5-2.5-18.5 LF-MCG/0.5 injection Inject 0.5 mLs into the muscle once.   [DISCONTINUED] Zoster Vaccine Adjuvanted [facility]) injection Inject 0.5 mLs into the muscle once.   Tdap (BOOSTRIX) 5-2.5-18.5 LF-MCG/0.5 injection Inject 0.5 mLs into the muscle once for 1 dose.   Zoster Vaccine Adjuvanted [facility]) injection Inject 0.5 mLs into the muscle once for 1 dose.   [DISCONTINUED] amoxicillin-clavulanate (AUGMENTIN) 875-125 MG tablet Take 1 tablet by mouth 2 (two) times daily. (Patient not taking: Reported on 03/10/2022)   [DISCONTINUED] nitrofurantoin, macrocrystal-monohydrate, (MACROBID) 100 MG  capsule Take 1 capsule (100 mg total) by mouth 2 (two) times daily. (Patient not taking: Reported on 03/10/2022)   [DISCONTINUED] promethazine-dextromethorphan (PROMETHAZINE-DM) 6.25-15 MG/5ML syrup Take 5 mLs by mouth 3 (three) times daily as needed for cough. (Patient not taking: Reported on 03/10/2022)   [DISCONTINUED] sertraline (ZOLOFT) 50 MG tablet Take 1 tablet (50 mg total) by mouth daily. (Patient not taking: Reported on 03/10/2022)   No facility-administered encounter medications on file as of 03/10/2022.    Surgical History: Past Surgical History:  Procedure Laterality Date   ABDOMINAL HYSTERECTOMY      Medical History: Past Medical History:  Diagnosis Date   Depression     Family History: Family History  Problem Relation Age of Onset   Hypertension Mother    Heart attack Father    Breast cancer Neg Hx     Social History   Socioeconomic History   Marital status: Married    Spouse name: Not on file   Number of children: Not on file   Years of education: Not on file   Highest education level: Not on file  Occupational History   Not on file  Tobacco Use   Smoking status: Never   Smokeless tobacco: Never  Vaping Use   Vaping Use: Never used  Substance and Sexual Activity   Alcohol use: Yes    Comment: ocassional   Drug use: Never   Sexual activity: Not on file  Other Topics Concern   Not on file  Social History Narrative   Not on file   Social Determinants of Health  Financial Resource Strain: Not on file  Food Insecurity: Not on file  Transportation Needs: Not on file  Physical Activity: Not on file  Stress: Not on file  Social Connections: Not on file  Intimate Partner Violence: Not on file      Review of Systems  Constitutional:  Negative for chills, fatigue and unexpected weight change.  HENT:  Negative for congestion, rhinorrhea, sneezing and sore throat.   Eyes:  Negative for redness.  Respiratory:  Negative for cough, chest tightness and  shortness of breath.   Cardiovascular:  Negative for chest pain and palpitations.  Gastrointestinal:  Negative for abdominal pain, constipation, diarrhea, nausea and vomiting.  Genitourinary:  Negative for dysuria and frequency.  Musculoskeletal:  Negative for arthralgias, back pain, joint swelling and neck pain.  Skin:  Negative for rash.  Neurological: Negative.  Negative for tremors and numbness.  Hematological:  Negative for adenopathy. Does not bruise/bleed easily.  Psychiatric/Behavioral:  Negative for behavioral problems (Depression), sleep disturbance and suicidal ideas. The patient is not nervous/anxious.     Vital Signs: BP 124/63   Pulse 86   Temp 98.5 F (36.9 C)   Resp 16   Ht 5\' 8"  (1.727 m)   Wt 162 lb 6.4 oz (73.7 kg)   SpO2 98%   BMI 24.69 kg/m    Physical Exam Vitals reviewed.  Constitutional:      General: She is not in acute distress.    Appearance: Normal appearance. She is normal weight. She is not ill-appearing.  HENT:     Head: Normocephalic and atraumatic.  Eyes:     Pupils: Pupils are equal, round, and reactive to light.  Cardiovascular:     Rate and Rhythm: Normal rate and regular rhythm.  Pulmonary:     Effort: Pulmonary effort is normal. No respiratory distress.  Neurological:     Mental Status: She is alert and oriented to person, place, and time.  Psychiatric:        Mood and Affect: Mood normal.        Behavior: Behavior normal.        Assessment/Plan: 1. Mixed hyperlipidemia Repeat lipid panel to evaluate for improvement.  - Lipid Profile  2. Unspecified menopausal and perimenopausal disorder Continue estrace tablet as prescribed.   3. Need for vaccination - Tdap (BOOSTRIX) 5-2.5-18.5 LF-MCG/0.5 injection; Inject 0.5 mLs into the muscle once for 1 dose.  Dispense: 0.5 mL; Refill: 0 - Zoster Vaccine Adjuvanted [facility]) injection; Inject 0.5 mLs into the muscle once for 1 dose.  Dispense: 0.5 mL; Refill: 0   General  Counseling: Heather Wolf verbalizes understanding of the findings of todays visit and agrees with plan of treatment. I have discussed any further diagnostic evaluation that may be needed or ordered today. We also reviewed her medications today. she has been encouraged to call the office with any questions or concerns that should arise related to todays visit.    Orders Placed This Encounter  Procedures   Lipid Profile    Meds ordered this encounter  Medications   Tdap (BOOSTRIX) 5-2.5-18.5 LF-MCG/0.5 injection    Sig: Inject 0.5 mLs into the muscle once for 1 dose.    Dispense:  0.5 mL    Refill:  0   Zoster Vaccine Adjuvanted [facility]) injection    Sig: Inject 0.5 mLs into the muscle once for 1 dose.    Dispense:  0.5 mL    Refill:  0    Return for previously scheduled, CPE, Klea Nall PCP  in february 2024, and as needed otherwise. .   Total time spent:30 Minutes Time spent includes review of chart, medications, test results, and follow up plan with the patient.    Controlled Substance Database was reviewed by me.  This patient was seen by Sallyanne Kuster, FNP-C in collaboration with Dr. Beverely Risen as a part of collaborative care agreement.   Elam Ellis R. Tedd Sias, MSN, FNP-C Internal medicine

## 2022-04-25 DIAGNOSIS — M791 Myalgia, unspecified site: Secondary | ICD-10-CM | POA: Diagnosis not present

## 2022-04-25 DIAGNOSIS — Z6824 Body mass index (BMI) 24.0-24.9, adult: Secondary | ICD-10-CM | POA: Diagnosis not present

## 2022-04-25 DIAGNOSIS — R051 Acute cough: Secondary | ICD-10-CM | POA: Diagnosis not present

## 2022-04-25 DIAGNOSIS — U071 COVID-19: Secondary | ICD-10-CM | POA: Diagnosis not present

## 2022-07-19 ENCOUNTER — Ambulatory Visit: Payer: Self-pay

## 2022-07-19 ENCOUNTER — Ambulatory Visit
Admission: EM | Admit: 2022-07-19 | Discharge: 2022-07-19 | Disposition: A | Discharge status: Home / Self Care | Payer: BC Managed Care – PPO | Attending: Family | Admitting: Family

## 2022-07-19 DIAGNOSIS — J0140 Acute pansinusitis, unspecified: Secondary | ICD-10-CM | POA: Diagnosis not present

## 2022-07-19 DIAGNOSIS — J209 Acute bronchitis, unspecified: Secondary | ICD-10-CM | POA: Diagnosis not present

## 2022-07-19 MED ORDER — AMOXICILLIN-POT CLAVULANATE 875-125 MG PO TABS
1.0000 | ORAL_TABLET | Freq: Two times a day (BID) | ORAL | 0 refills | Status: AC
Start: 2022-07-19 — End: 2022-07-26

## 2022-07-19 MED ORDER — ALBUTEROL SULFATE HFA 108 (90 BASE) MCG/ACT IN AERS: 2.0000 | INHALATION_SPRAY | RESPIRATORY_TRACT | 0 refills | Status: DC | PRN

## 2022-07-19 NOTE — Discharge Instructions (Signed)
Recommend start Augmentin 875mg  twice a day as directed-take with food. May continue OTC Mucinex DM as directed. Start Albuterol inhaler 2 puffs every 4 to 6 hours as needed for cough/wheezing. Recommend follow-up in 3 to 4 days if not improving.

## 2022-07-19 NOTE — ED Triage Notes (Signed)
Pt c/o congestion, cough onset last week , pt states Saturday started settling in chest

## 2022-07-19 NOTE — ED Provider Notes (Signed)
[facility]    CSN: [id number] Arrival date & time: 07/19/22  [id number]      History   Chief Complaint Chief Complaint  Patient presents with   Cough    HPI Heather Wolf is a 51 y.o. female.   51 year old female presents with nasal congestion and cough for over 1 week. Low grade fever at first. Getting worse with more sinus pressure, ear pressure and headaches. Denies any sorethroat or GI symptoms. Has been taking Mucinex DM and Motrin with some relief. No other family members ill. Other chronic health issues include environmental allergies which is controlled with Claritin daily. Other medications include Estrace and 5-HTP. Has not needed to use an inhaler in many years.   The history is provided by the patient.    Past Medical History:  Diagnosis Date   Depression     Patient Active Problem List   Diagnosis Date Noted   Hyperlipidemia LDL goal <100 06/17/2019   Encounter for general adult medical examination with abnormal findings 05/11/2019   Other fatigue 05/11/2019   Raynaud's disease without gangrene 05/11/2019   Multinodular goiter 05/11/2019   Screening for breast cancer 05/08/2018   Unspecified menopausal and perimenopausal disorder 05/08/2018   Generalized anxiety disorder 05/08/2018   Vitamin D deficiency 05/08/2018   Dysuria 05/08/2018    Past Surgical History:  Procedure Laterality Date   ABDOMINAL HYSTERECTOMY      OB History   No obstetric history on file.      Home Medications    Prior to Admission medications   Medication Sig Start Date End Date Taking? Authorizing Provider  albuterol (VENTOLIN HFA) 108 (90 Base) MCG/ACT inhaler Inhale 2 puffs into the lungs every 4 (four) hours as needed for wheezing or shortness of breath (or cough). 07/19/22  Yes AmyotNicholes Stairs, NP  amoxicillin-clavulanate (AUGMENTIN) 875-125 MG tablet Take 1 tablet by mouth every 12 (twelve) hours for 7 days. 07/19/22 07/26/22 Yes Dayne Chait, Nicholes Stairs, NP   estradiol (ESTRACE) 1 MG tablet Take 1 tablet (1 mg total) by mouth daily. 09/09/21  Yes Abernathy, Alyssa, NP  5-Hydroxytryptophan (5-HTP) 200 MG TABS Take 1 tablet by mouth at bedtime.    [provider]    Family History Family History  Problem Relation Age of Onset   Hypertension Mother    Heart attack Father    Breast cancer Neg Hx     Social History Social History   Tobacco Use   Smoking status: Never   Smokeless tobacco: Never  Vaping Use   Vaping Use: Never used  Substance Use Topics   Alcohol use: Yes    Comment: ocassional   Drug use: Never     Allergies   Patient has no known allergies.   Review of Systems Review of Systems  Constitutional:  Positive for fatigue and fever (low grade). Negative for appetite change and chills.  HENT:  Positive for congestion (sinus and chest), ear pain (pressure bilaterally), sinus pressure and sinus pain. Negative for ear discharge, mouth sores, nosebleeds, postnasal drip, sore throat and trouble swallowing.   Eyes:  Negative for pain, discharge and redness.  Respiratory:  Positive for cough and chest tightness. Negative for shortness of breath.   Gastrointestinal:  Negative for diarrhea, nausea and vomiting.  Musculoskeletal:  Negative for arthralgias, myalgias, neck pain and neck stiffness.  Skin:  Negative for color change and rash.  Allergic/Immunologic: Positive for environmental allergies. Negative for food allergies and immunocompromised state.  Neurological:  Positive for light-headedness and headaches. Negative for dizziness, tremors, seizures, syncope and numbness.  Hematological:  Negative for adenopathy. Does not bruise/bleed easily.     Physical Exam Triage Vital Signs ED Triage Vitals  Enc Vitals Group     BP 07/19/22 1152 123/77     Pulse Rate 07/19/22 1152 65     Resp --      Temp 07/19/22 1152 98.2 F (36.8 C)     Temp Source 07/19/22 1152 Oral     SpO2 07/19/22 1152 98 %     Weight 07/19/22  1151 160 lb (72.6 kg)     Height 07/19/22 1151 5\' 8"  (1.727 m)     Head Circumference --      Peak Flow --      Pain Score 07/19/22 1151 0     Pain Loc --      Pain Edu? --      Excl. in GC? --    No data found.  Updated Vital Signs BP 123/77 (BP Location: Left Arm)   Pulse 65   Temp 98.2 F (36.8 C) (Oral)   Ht 5\' 8"  (1.727 m)   Wt 160 lb (72.6 kg)   SpO2 98%   BMI 24.33 kg/m   Visual Acuity Right Eye Distance:   Left Eye Distance:   Bilateral Distance:    Right Eye Near:   Left Eye Near:    Bilateral Near:     Physical Exam Vitals and nursing note reviewed.  Constitutional:      General: She is awake. She is not in acute distress.    Appearance: She is well-developed and well-groomed. She is ill-appearing.     Comments: She is sitting on the exam chair in no acute distress but appears tired and ill.   HENT:     Head: Normocephalic and atraumatic.     Right Ear: Hearing, ear canal and external ear normal. Tympanic membrane is bulging. Tympanic membrane is not injected or erythematous.     Left Ear: Hearing, ear canal and external ear normal. Tympanic membrane is bulging. Tympanic membrane is not injected or erythematous.     Nose: Congestion present.     Right Sinus: Maxillary sinus tenderness and frontal sinus tenderness present.     Left Sinus: Maxillary sinus tenderness and frontal sinus tenderness present.     Mouth/Throat:     Lips: Pink.     Mouth: Mucous membranes are moist.     Pharynx: Uvula midline. Oropharyngeal exudate and posterior oropharyngeal erythema present. No pharyngeal swelling or uvula swelling.     Comments: Slight yellowish post nasal drainage present Eyes:     Extraocular Movements: Extraocular movements intact.     Conjunctiva/sclera: Conjunctivae normal.  Cardiovascular:     Rate and Rhythm: Normal rate and regular rhythm.     Heart sounds: Normal heart sounds. No murmur heard. Pulmonary:     Effort: Pulmonary effort is normal. No  respiratory distress or retractions.     Breath sounds: Normal air entry. No decreased air movement. Examination of the right-upper field reveals wheezing and rhonchi. Examination of the left-upper field reveals wheezing and rhonchi. Wheezing and rhonchi present. No decreased breath sounds or rales.     Comments: Wheezes and coarse breath sounds mainly in upper lobes especially when coughing.  Musculoskeletal:     Cervical back: Normal range of motion and neck supple.  Lymphadenopathy:     Cervical: No cervical adenopathy.  Skin:  General: Skin is warm and dry.     Capillary Refill: Capillary refill takes less than 2 seconds.     Findings: No rash.  Neurological:     General: No focal deficit present.     Mental Status: She is alert and oriented to person, place, and time.  Psychiatric:        Mood and Affect: Mood normal.        Behavior: Behavior normal. Behavior is cooperative.        Thought Content: Thought content normal.        Judgment: Judgment normal.      UC Treatments / Results  Labs (all labs ordered are listed, but only abnormal results are displayed) Labs Reviewed - No data to display  EKG   Radiology No results found.  Procedures Procedures (including critical care time)  Medications Ordered in UC Medications - No data to display  Initial Impression / Assessment and Plan / UC Course  I have reviewed the triage vital signs and the nursing notes.  Pertinent labs & imaging results that were available during my care of the patient were reviewed by me and considered in my medical decision making (see chart for details).     Reviewed with patient that she appears to have a sinus infection with drainage causing her cough. Also having some wheezing and chest tightness. Will treat for possible bacterial etiology due to worsening of symptoms after a week. Will start Augmentin 875mg  twice a day as directed. May continue OTC Mucinex DM as needed to help loosen up  mucus in chest. May start Albuterol inhaler 2 puffs every 4 to 6 hours as needed for wheezing or cough. Rest. Continue to push fluids. Follow-up in 3 to 4 days if not improving.  Final Clinical Impressions(s) / UC Diagnoses   Final diagnoses:  Acute non-recurrent pansinusitis  Acute bronchitis, unspecified organism     Discharge Instructions      Recommend start Augmentin 875mg  twice a day as directed-take with food. May continue OTC Mucinex DM as directed. Start Albuterol inhaler 2 puffs every 4 to 6 hours as needed for cough/wheezing. Recommend follow-up in 3 to 4 days if not improving.     ED Prescriptions     Medication Sig Dispense Auth. Provider   amoxicillin-clavulanate (AUGMENTIN) 875-125 MG tablet Take 1 tablet by mouth every 12 (twelve) hours for 7 days. 14 tablet Oluwaferanmi Wain, Nicholes Stairs, NP   albuterol (VENTOLIN HFA) 108 (90 Base) MCG/ACT inhaler Inhale 2 puffs into the lungs every 4 (four) hours as needed for wheezing or shortness of breath (or cough). 18 g Katy Apo, NP      PDMP not reviewed this encounter.   Katy Apo, NP 07/19/22 1753

## 2022-09-01 ENCOUNTER — Encounter: Payer: BC Managed Care – PPO | Admitting: Nurse Practitioner

## 2022-09-16 ENCOUNTER — Encounter: Payer: Self-pay | Admitting: Nurse Practitioner

## 2022-09-16 ENCOUNTER — Ambulatory Visit (INDEPENDENT_AMBULATORY_CARE_PROVIDER_SITE_OTHER): Payer: BC Managed Care – PPO | Admitting: Nurse Practitioner

## 2022-09-16 VITALS — BP 130/68 | HR 64 | Temp 97.7°F | Resp 16 | Ht 68.0 in | Wt 166.6 lb

## 2022-09-16 DIAGNOSIS — Z23 Encounter for immunization: Secondary | ICD-10-CM

## 2022-09-16 DIAGNOSIS — E042 Nontoxic multinodular goiter: Secondary | ICD-10-CM

## 2022-09-16 DIAGNOSIS — M8580 Other specified disorders of bone density and structure, unspecified site: Secondary | ICD-10-CM

## 2022-09-16 DIAGNOSIS — R300 Dysuria: Secondary | ICD-10-CM | POA: Diagnosis not present

## 2022-09-16 DIAGNOSIS — E782 Mixed hyperlipidemia: Secondary | ICD-10-CM | POA: Diagnosis not present

## 2022-09-16 DIAGNOSIS — Z0001 Encounter for general adult medical examination with abnormal findings: Secondary | ICD-10-CM

## 2022-09-16 DIAGNOSIS — M722 Plantar fascial fibromatosis: Secondary | ICD-10-CM

## 2022-09-16 DIAGNOSIS — Z1231 Encounter for screening mammogram for malignant neoplasm of breast: Secondary | ICD-10-CM

## 2022-09-16 DIAGNOSIS — N959 Unspecified menopausal and perimenopausal disorder: Secondary | ICD-10-CM

## 2022-09-16 DIAGNOSIS — F411 Generalized anxiety disorder: Secondary | ICD-10-CM

## 2022-09-16 MED ORDER — ESTRADIOL 1 MG PO TABS: 1.0000 mg | ORAL_TABLET | Freq: Every day | ORAL | 3 refills | Status: DC

## 2022-09-16 MED ORDER — TETANUS-DIPHTH-ACELL PERTUSSIS 5-2.5-18.5 LF-MCG/0.5 IM SUSP: 0.5000 mL | Freq: Once | INTRAMUSCULAR | 0 refills | Status: AC

## 2022-09-16 MED ORDER — SERTRALINE HCL 50 MG PO TABS: 50.0000 mg | ORAL_TABLET | Freq: Every day | ORAL | 3 refills | Status: DC

## 2022-09-16 MED ORDER — ZOSTER VAC RECOMB ADJUVANTED 50 MCG/0.5ML IM SUSR: 0.5000 mL | Freq: Once | INTRAMUSCULAR | 0 refills | Status: AC

## 2022-09-16 NOTE — Progress Notes (Signed)
[facility], [place] [address]  Internal MEDICINE  Office Visit Note  Patient Name: Heather Wolf  [id number]  [id number]  Date of Service: 09/16/2022  Chief Complaint  Patient presents with   Annual Exam   Depression    HPI Heather Wolf presents for an annual well visit and physical exam.  Well-appearing 52 y.o. female with raynaud's, goiter, GAD and low vitamin D.  Routine CRC screening: due in September 2026 Routine mammogram: due in march this year.  DEXA scan: last one was 2015 -- showed osteopenia.  Labs:  routine labs ordered New or worsening pain: none Other concerns:  right plantar fasciitis -- very painful.  Had weaned off sertraline last year but then started taking it again because her anxiety increased. Wants to continue sertraline 50 mg daily.     Current Medication: Outpatient Encounter Medications as of 09/16/2022  Medication Sig   5-Hydroxytryptophan (5-HTP) 200 MG TABS Take 1 tablet by mouth at bedtime.   albuterol (VENTOLIN HFA) 108 (90 Base) MCG/ACT inhaler Inhale 2 puffs into the lungs every 4 (four) hours as needed for wheezing or shortness of breath (or cough).   sertraline (ZOLOFT) 50 MG tablet Take 1 tablet (50 mg total) by mouth daily.   [DISCONTINUED] estradiol (ESTRACE) 1 MG tablet Take 1 tablet (1 mg total) by mouth daily.   [DISCONTINUED] Tdap (BOOSTRIX) 5-2.5-18.5 LF-MCG/0.5 injection Inject 0.5 mLs into the muscle once.   [DISCONTINUED] Zoster Vaccine Adjuvanted [facility]) injection Inject 0.5 mLs into the muscle once.   estradiol (ESTRACE) 1 MG tablet Take 1 tablet (1 mg total) by mouth daily.   [EXPIRED] Tdap (BOOSTRIX) 5-2.5-18.5 LF-MCG/0.5 injection Inject 0.5 mLs into the muscle once for 1 dose.   [EXPIRED] Zoster Vaccine Adjuvanted [facility]) injection Inject 0.5 mLs into the muscle once for 1 dose.   No facility-administered encounter medications on file as of 09/16/2022.    Surgical History: Past Surgical  History:  Procedure Laterality Date   ABDOMINAL HYSTERECTOMY      Medical History: Past Medical History:  Diagnosis Date   Depression     Family History: Family History  Problem Relation Age of Onset   Hypertension Mother    Heart attack Father    Breast cancer Neg Hx     Social History   Socioeconomic History   Marital status: Married    Spouse name: Not on file   Number of children: Not on file   Years of education: Not on file   Highest education level: Not on file  Occupational History   Not on file  Tobacco Use   Smoking status: Never   Smokeless tobacco: Never  Vaping Use   Vaping Use: Never used  Substance and Sexual Activity   Alcohol use: Yes    Comment: ocassional   Drug use: Never   Sexual activity: Not on file  Other Topics Concern   Not on file  Social History Narrative   Not on file   Social Determinants of Health   Financial Resource Strain: Not on file  Food Insecurity: Not on file  Transportation Needs: Not on file  Physical Activity: Not on file  Stress: Not on file  Social Connections: Not on file  Intimate Partner Violence: Not on file      Review of Systems  Constitutional:  Negative for activity change, appetite change, chills, fatigue, fever and unexpected weight change.  HENT: Negative.  Negative for congestion, ear pain, rhinorrhea, sore throat and  trouble swallowing.   Eyes: Negative.   Respiratory: Negative.  Negative for cough, chest tightness, shortness of breath and wheezing.   Cardiovascular: Negative.  Negative for chest pain.  Gastrointestinal: Negative.  Negative for abdominal pain, blood in stool, constipation, diarrhea, nausea and vomiting.  Endocrine: Negative.   Genitourinary: Negative.  Negative for difficulty urinating, dysuria, frequency, hematuria and urgency.  Musculoskeletal: Negative.  Negative for arthralgias, back pain, joint swelling, myalgias and neck pain.  Skin: Negative.  Negative for rash and  wound.  Allergic/Immunologic: Negative.  Negative for immunocompromised state.  Neurological: Negative.  Negative for dizziness, seizures, numbness and headaches.  Hematological: Negative.   Psychiatric/Behavioral:  Negative for behavioral problems, self-injury, sleep disturbance and suicidal ideas. The patient is nervous/anxious.     Vital Signs: BP 130/68   Pulse 64   Temp 97.7 F (36.5 C)   Resp 16   Ht 5' 8"$  (1.727 m)   Wt 166 lb 9.6 oz (75.6 kg)   SpO2 99%   BMI 25.33 kg/m    Physical Exam Vitals reviewed.  Constitutional:      General: She is not in acute distress.    Appearance: Normal appearance. She is well-developed and normal weight. She is not ill-appearing or diaphoretic.  HENT:     Head: Normocephalic and atraumatic.     Right Ear: Tympanic membrane, ear canal and external ear normal.     Left Ear: Tympanic membrane, ear canal and external ear normal.     Nose: Nose normal. No congestion or rhinorrhea.     Mouth/Throat:     Mouth: Mucous membranes are moist.     Pharynx: Oropharynx is clear. No oropharyngeal exudate or posterior oropharyngeal erythema.  Eyes:     General: No scleral icterus.       Right eye: No discharge.        Left eye: No discharge.     Extraocular Movements: Extraocular movements intact.     Conjunctiva/sclera: Conjunctivae normal.     Pupils: Pupils are equal, round, and reactive to light.  Neck:     Thyroid: No thyromegaly.     Vascular: No JVD.     Trachea: No tracheal deviation.  Cardiovascular:     Rate and Rhythm: Normal rate and regular rhythm.     Pulses: Normal pulses.     Heart sounds: Normal heart sounds. No murmur heard.    No friction rub. No gallop.  Pulmonary:     Effort: Pulmonary effort is normal. No respiratory distress.     Breath sounds: Normal breath sounds. No stridor. No wheezing or rales.  Chest:     Chest wall: No tenderness.  Abdominal:     General: Bowel sounds are normal. There is no distension.      Palpations: Abdomen is soft. There is no mass.     Tenderness: There is no abdominal tenderness. There is no guarding or rebound.  Musculoskeletal:        General: No tenderness or deformity. Normal range of motion.     Cervical back: Normal range of motion and neck supple.  Lymphadenopathy:     Cervical: No cervical adenopathy.  Skin:    General: Skin is warm and dry.     Capillary Refill: Capillary refill takes less than 2 seconds.     Coloration: Skin is not pale.     Findings: No erythema or rash.  Neurological:     Mental Status: She is alert and oriented to person, place,  and time.     Cranial Nerves: No cranial nerve deficit.     Motor: No abnormal muscle tone.     Coordination: Coordination normal.     Deep Tendon Reflexes: Reflexes are normal and symmetric.  Psychiatric:        Mood and Affect: Mood normal.        Behavior: Behavior normal.        Thought Content: Thought content normal.        Judgment: Judgment normal.        Assessment/Plan: 1. Encounter for routine adult health examination with abnormal findings Age-appropriate preventive screenings and vaccinations discussed, annual physical exam completed. Routine labs for health maintenance ordered, see below. PHM updated.  - CBC with Differential/Platelet - CMP14+EGFR - Lipid Profile - Vitamin D (25 hydroxy)  2. Plantar fasciitis of right foot Information and stretches provided to patient.  3. Osteopenia, unspecified location DEXA scan ordered/  - DG Bone Density; Future  4. Multinodular goiter Routine labs ordered  - CBC with Differential/Platelet - CMP14+EGFR - Lipid Profile - Vitamin D (25 hydroxy)  5. Mixed hyperlipidemia Routine labs ordered.  - CBC with Differential/Platelet - CMP14+EGFR - Lipid Profile - Vitamin D (25 hydroxy)  6. Unspecified menopausal and perimenopausal disorder DEXA scan ordered. Estrace tablet reordered.  - estradiol (ESTRACE) 1 MG tablet; Take 1 tablet (1 mg  total) by mouth daily.  Dispense: 90 tablet; Refill: 3 - DG Bone Density; Future  7. Dysuria Routine urinalysis done  - UA/M w/rflx Culture, Routine - Microscopic Examination  8. Encounter for screening mammogram for malignant neoplasm of breast Routine mammogram ordered.  - MM 3D SCREEN BREAST BILATERAL; Future  9. Need for vaccination - Tdap ([place]) 5-2.5-18.5 LF-MCG/0.5 injection; Inject 0.5 mLs into the muscle once for 1 dose.  Dispense: 0.5 mL; Refill: 0 - Zoster Vaccine Adjuvanted [facility]) injection; Inject 0.5 mLs into the muscle once for 1 dose.  Dispense: 0.5 mL; Refill: 0  10. Generalized anxiety disorder Restart sertraline - sertraline (ZOLOFT) 50 MG tablet; Take 1 tablet (50 mg total) by mouth daily.  Dispense: 90 tablet; Refill: 3      General Counseling: Hadlyn verbalizes understanding of the findings of todays visit and agrees with plan of treatment. I have discussed any further diagnostic evaluation that may be needed or ordered today. We also reviewed her medications today. she has been encouraged to call the office with any questions or concerns that should arise related to todays visit.    Orders Placed This Encounter  Procedures   Microscopic Examination   MM 3D SCREEN BREAST BILATERAL   DG Bone Density   UA/M w/rflx Culture, Routine   CBC with Differential/Platelet   CMP14+EGFR   Lipid Profile   Vitamin D (25 hydroxy)    Meds ordered this encounter  Medications   Tdap (BOOSTRIX) 5-2.5-18.5 LF-MCG/0.5 injection    Sig: Inject 0.5 mLs into the muscle once for 1 dose.    Dispense:  0.5 mL    Refill:  0   Zoster Vaccine Adjuvanted [facility]) injection    Sig: Inject 0.5 mLs into the muscle once for 1 dose.    Dispense:  0.5 mL    Refill:  0   sertraline (ZOLOFT) 50 MG tablet    Sig: Take 1 tablet (50 mg total) by mouth daily.    Dispense:  90 tablet    Refill:  3   estradiol (ESTRACE) 1 MG tablet    Sig: Take 1 tablet (  1 mg total) by mouth  daily.    Dispense:  90 tablet    Refill:  3    Return in about 1 year (around 09/17/2023) for CPE, Chasya Zenz PCP and otherwise as needed. .   Total time spent:30 Minutes Time spent includes review of chart, medications, test results, and follow up plan with the patient.   [place] Controlled Substance Database was reviewed by me.  This patient was seen by Jonetta Osgood, FNP-C in collaboration with Dr. Clayborn Bigness as a part of collaborative care agreement.  Sabena Winner R. Valetta Fuller, MSN, FNP-C Internal medicine

## 2022-09-17 LAB — UA/M W/RFLX CULTURE, ROUTINE
Bilirubin, UA: NEGATIVE
Glucose, UA: NEGATIVE
Ketones, UA: NEGATIVE
Leukocytes,UA: NEGATIVE
Nitrite, UA: NEGATIVE
Protein,UA: NEGATIVE
RBC, UA: NEGATIVE
Specific Gravity, UA: 1.009 (ref 1.005–1.030)
Urobilinogen, Ur: 0.2 mg/dL (ref 0.2–1.0)
pH, UA: 7.5 (ref 5.0–7.5)

## 2022-09-17 LAB — MICROSCOPIC EXAMINATION
Bacteria, UA: NONE SEEN
Casts: NONE SEEN /lpf
RBC, Urine: NONE SEEN /hpf (ref 0–2)
WBC, UA: NONE SEEN /hpf (ref 0–5)

## 2022-09-19 ENCOUNTER — Encounter: Payer: Self-pay | Admitting: Nurse Practitioner

## 2022-09-23 DIAGNOSIS — E042 Nontoxic multinodular goiter: Secondary | ICD-10-CM | POA: Diagnosis not present

## 2022-09-23 DIAGNOSIS — E782 Mixed hyperlipidemia: Secondary | ICD-10-CM | POA: Diagnosis not present

## 2022-09-24 LAB — CBC WITH DIFFERENTIAL/PLATELET
Basophils Absolute: 0 10*3/uL (ref 0.0–0.2)
Basos: 1 %
EOS (ABSOLUTE): 0.1 10*3/uL (ref 0.0–0.4)
Eos: 1 %
Hematocrit: 40.9 % (ref 34.0–46.6)
Hemoglobin: 14 g/dL (ref 11.1–15.9)
Immature Grans (Abs): 0 10*3/uL (ref 0.0–0.1)
Immature Granulocytes: 0 %
Lymphocytes Absolute: 0.8 10*3/uL (ref 0.7–3.1)
Lymphs: 14 %
MCH: 29.7 pg (ref 26.6–33.0)
MCHC: 34.2 g/dL (ref 31.5–35.7)
MCV: 87 fL (ref 79–97)
Monocytes Absolute: 0.3 10*3/uL (ref 0.1–0.9)
Monocytes: 5 %
Neutrophils Absolute: 4.6 10*3/uL (ref 1.4–7.0)
Neutrophils: 79 %
Platelets: 271 10*3/uL (ref 150–450)
RBC: 4.72 x10E6/uL (ref 3.77–5.28)
RDW: 12.5 % (ref 11.7–15.4)
WBC: 5.8 10*3/uL (ref 3.4–10.8)

## 2022-09-24 LAB — LIPID PANEL
Chol/HDL Ratio: 3 ratio (ref 0.0–4.4)
Cholesterol, Total: 263 mg/dL — ABNORMAL HIGH (ref 100–199)
HDL: 89 mg/dL (ref >39)
LDL Chol Calc (NIH): 166 mg/dL — ABNORMAL HIGH (ref 0–99)
Triglycerides: 51 mg/dL (ref 0–149)
VLDL Cholesterol Cal: 8 mg/dL (ref 5–40)

## 2022-09-24 LAB — CMP14+EGFR
ALT: 14 IU/L (ref 0–32)
AST: 15 IU/L (ref 0–40)
Albumin/Globulin Ratio: 2.2 (ref 1.2–2.2)
Albumin: 4.6 g/dL (ref 3.8–4.9)
Alkaline Phosphatase: 70 IU/L (ref 44–121)
BUN/Creatinine Ratio: 18 (ref 9–23)
BUN: 13 mg/dL (ref 6–24)
Bilirubin Total: 0.3 mg/dL (ref 0.0–1.2)
CO2: 24 mmol/L (ref 20–29)
Calcium: 9.1 mg/dL (ref 8.7–10.2)
Chloride: 103 mmol/L (ref 96–106)
Creatinine, Ser: 0.71 mg/dL (ref 0.57–1.00)
Globulin, Total: 2.1 g/dL (ref 1.5–4.5)
Glucose: 88 mg/dL (ref 70–99)
Potassium: 4.4 mmol/L (ref 3.5–5.2)
Sodium: 142 mmol/L (ref 134–144)
Total Protein: 6.7 g/dL (ref 6.0–8.5)
eGFR: 103 mL/min/{1.73_m2} (ref >59)

## 2022-09-24 LAB — VITAMIN D 25 HYDROXY (VIT D DEFICIENCY, FRACTURES): Vit D, 25-Hydroxy: 55.8 ng/mL (ref 30.0–100.0)

## 2022-10-25 ENCOUNTER — Telehealth: Payer: Self-pay

## 2022-10-25 NOTE — Telephone Encounter (Signed)
Patient notified

## 2022-10-25 NOTE — Telephone Encounter (Signed)
-----   Message from Jonetta Osgood, NP sent at 10/25/2022  8:44 AM EDT ----- Please let patient know  All labs are normal except for her cholesterol but it continues to improve compared to the previous year.

## 2022-10-25 NOTE — Telephone Encounter (Signed)
Left message for patient to give office a call.

## 2022-10-25 NOTE — Progress Notes (Signed)
Please let patient know  All labs are normal except for her cholesterol but it continues to improve compared to the previous year.

## 2022-11-04 ENCOUNTER — Ambulatory Visit
Admission: RE | Admit: 2022-11-04 | Discharge: 2022-11-04 | Disposition: A | Discharge status: Home / Self Care | Payer: BC Managed Care – PPO | Source: Ambulatory Visit | Attending: Nurse Practitioner | Admitting: Nurse Practitioner

## 2022-11-04 DIAGNOSIS — Z1231 Encounter for screening mammogram for malignant neoplasm of breast: Secondary | ICD-10-CM | POA: Insufficient documentation

## 2022-11-04 DIAGNOSIS — N959 Unspecified menopausal and perimenopausal disorder: Secondary | ICD-10-CM

## 2022-11-04 DIAGNOSIS — M8588 Other specified disorders of bone density and structure, other site: Secondary | ICD-10-CM | POA: Diagnosis not present

## 2022-11-04 DIAGNOSIS — M8580 Other specified disorders of bone density and structure, unspecified site: Secondary | ICD-10-CM | POA: Insufficient documentation

## 2023-07-29 ENCOUNTER — Other Ambulatory Visit: Payer: Self-pay | Admitting: Nurse Practitioner

## 2023-07-29 DIAGNOSIS — F411 Generalized anxiety disorder: Secondary | ICD-10-CM

## 2023-07-29 NOTE — Telephone Encounter (Signed)
Last 2/24 and 2/25

## 2023-09-05 ENCOUNTER — Other Ambulatory Visit: Payer: Self-pay | Admitting: Nurse Practitioner

## 2023-09-05 DIAGNOSIS — F411 Generalized anxiety disorder: Secondary | ICD-10-CM

## 2023-09-05 NOTE — Telephone Encounter (Signed)
Please review

## 2023-09-22 ENCOUNTER — Ambulatory Visit: Payer: BC Managed Care – PPO | Admitting: Nurse Practitioner

## 2023-09-22 ENCOUNTER — Encounter: Payer: Self-pay | Admitting: Nurse Practitioner

## 2023-09-22 VITALS — BP 116/88 | HR 87 | Temp 98.1°F | Resp 16 | Ht 68.0 in | Wt 181.6 lb

## 2023-09-22 DIAGNOSIS — H11151 Pinguecula, right eye: Secondary | ICD-10-CM | POA: Diagnosis not present

## 2023-09-22 DIAGNOSIS — Z0001 Encounter for general adult medical examination with abnormal findings: Secondary | ICD-10-CM

## 2023-09-22 DIAGNOSIS — N959 Unspecified menopausal and perimenopausal disorder: Secondary | ICD-10-CM | POA: Diagnosis not present

## 2023-09-22 DIAGNOSIS — E782 Mixed hyperlipidemia: Secondary | ICD-10-CM

## 2023-09-22 DIAGNOSIS — E559 Vitamin D deficiency, unspecified: Secondary | ICD-10-CM

## 2023-09-22 DIAGNOSIS — Z1231 Encounter for screening mammogram for malignant neoplasm of breast: Secondary | ICD-10-CM

## 2023-09-22 MED ORDER — ESTRADIOL 1 MG PO TABS: 1.0000 mg | ORAL_TABLET | Freq: Every day | ORAL | 3 refills | Status: AC

## 2023-09-22 NOTE — Progress Notes (Signed)
 [facility] [address] [place], [place] [address]  Internal MEDICINE  Office Visit Note  Patient Name: Heather Wolf  [id number]  [id number]  Date of Service: 09/22/2023  Chief Complaint  Patient presents with   Depression   Annual Exam    HPI Heather Wolf presents for an annual well visit and physical exam.  Well-appearing 53 y.o. female with  raynaud's, goiter, GAD and low vitamin D.  Routine CRC screening: due in 2026 Routine mammogram: due now  Eye exam: recently had new eyeglass prescription, has astigmatism  Labs: due for routine labs  New or worsening pain: none  Other concerns: none    Current Medication: Outpatient Encounter Medications as of 09/22/2023  Medication Sig   5-Hydroxytryptophan (5-HTP) 200 MG TABS Take 1 tablet by mouth at bedtime.   sertraline (ZOLOFT) 50 MG tablet TAKE 1 TABLET BY MOUTH EVERY DAY   [DISCONTINUED] albuterol (VENTOLIN HFA) 108 (90 Base) MCG/ACT inhaler Inhale 2 puffs into the lungs every 4 (four) hours as needed for wheezing or shortness of breath (or cough).   [DISCONTINUED] estradiol (ESTRACE) 1 MG tablet Take 1 tablet (1 mg total) by mouth daily.   estradiol (ESTRACE) 1 MG tablet Take 1 tablet (1 mg total) by mouth daily.   No facility-administered encounter medications on file as of 09/22/2023.    Surgical History: Past Surgical History:  Procedure Laterality Date   ABDOMINAL HYSTERECTOMY      Medical History: Past Medical History:  Diagnosis Date   Depression     Family History: Family History  Problem Relation Age of Onset   Hypertension Mother    Heart attack Father    Breast cancer Neg Hx     Social History   Socioeconomic History   Marital status: Married    Spouse name: Not on file   Number of children: Not on file   Years of education: Not on file   Highest education level: Not on file  Occupational History   Not on file  Tobacco Use   Smoking status: Never   Smokeless tobacco: Never   Vaping Use   Vaping status: Never Used  Substance and Sexual Activity   Alcohol use: Yes    Comment: ocassional   Drug use: Never   Sexual activity: Not on file  Other Topics Concern   Not on file  Social History Narrative   Not on file   Social Drivers of Health   Financial Resource Strain: Not on file  Food Insecurity: Not on file  Transportation Needs: Not on file  Physical Activity: Not on file  Stress: Not on file  Social Connections: Not on file  Intimate Partner Violence: Not on file      Review of Systems  Constitutional:  Negative for activity change, appetite change, chills, fatigue, fever and unexpected weight change.  HENT: Negative.  Negative for congestion, ear pain, rhinorrhea, sore throat and trouble swallowing.   Eyes: Negative.   Respiratory: Negative.  Negative for cough, chest tightness, shortness of breath and wheezing.   Cardiovascular: Negative.  Negative for chest pain.  Gastrointestinal: Negative.  Negative for abdominal pain, blood in stool, constipation, diarrhea, nausea and vomiting.  Endocrine: Negative.   Genitourinary: Negative.  Negative for difficulty urinating, dysuria, frequency, hematuria and urgency.  Musculoskeletal: Negative.  Negative for arthralgias, back pain, joint swelling, myalgias and neck pain.  Skin: Negative.  Negative for rash and wound.  Allergic/Immunologic: Negative.  Negative for immunocompromised state.  Neurological: Negative.  Negative for dizziness, seizures, numbness and headaches.  Hematological: Negative.   Psychiatric/Behavioral:  Positive for depression. Negative for behavioral problems, self-injury, sleep disturbance and suicidal ideas. The patient is nervous/anxious.     Vital Signs: BP 116/88   Pulse 87   Temp 98.1 F (36.7 C)   Resp 16   Ht 5\' 8"  (1.727 m)   Wt 181 lb 9.6 oz (82.4 kg)   SpO2 99%   BMI 27.61 kg/m    Physical Exam Vitals reviewed.  Constitutional:      General: She is not in  acute distress.    Appearance: Normal appearance. She is well-developed and normal weight. She is not ill-appearing or diaphoretic.  HENT:     Head: Normocephalic and atraumatic.     Right Ear: Tympanic membrane, ear canal and external ear normal.     Left Ear: Tympanic membrane, ear canal and external ear normal.     Nose: Nose normal. No congestion or rhinorrhea.     Mouth/Throat:     Mouth: Mucous membranes are moist.     Pharynx: Oropharynx is clear. No oropharyngeal exudate or posterior oropharyngeal erythema.  Eyes:     General: No scleral icterus.       Right eye: No discharge.        Left eye: No discharge.     Extraocular Movements: Extraocular movements intact.     Conjunctiva/sclera: Conjunctivae normal.     Pupils: Pupils are equal, round, and reactive to light.  Neck:     Thyroid: No thyromegaly.     Vascular: No JVD.     Trachea: No tracheal deviation.  Cardiovascular:     Rate and Rhythm: Normal rate and regular rhythm.     Pulses: Normal pulses.     Heart sounds: Normal heart sounds. No murmur heard.    No friction rub. No gallop.  Pulmonary:     Effort: Pulmonary effort is normal. No respiratory distress.     Breath sounds: Normal breath sounds. No stridor. No wheezing or rales.  Chest:     Chest wall: No tenderness.  Abdominal:     General: Bowel sounds are normal. There is no distension.     Palpations: Abdomen is soft. There is no mass.     Tenderness: There is no abdominal tenderness. There is no guarding or rebound.  Musculoskeletal:        General: No tenderness or deformity. Normal range of motion.     Cervical back: Normal range of motion and neck supple.  Lymphadenopathy:     Cervical: No cervical adenopathy.  Skin:    General: Skin is warm and dry.     Capillary Refill: Capillary refill takes less than 2 seconds.     Coloration: Skin is not pale.     Findings: No erythema or rash.  Neurological:     Mental Status: She is alert and oriented to  person, place, and time.     Cranial Nerves: No cranial nerve deficit.     Motor: No abnormal muscle tone.     Coordination: Coordination normal.     Deep Tendon Reflexes: Reflexes are normal and symmetric.  Psychiatric:        Mood and Affect: Mood normal.        Behavior: Behavior normal.        Thought Content: Thought content normal.        Judgment: Judgment normal.        Assessment/Plan: 1. Encounter  for routine adult health examination with abnormal findings (Primary) Age-appropriate preventive screenings and vaccinations discussed, annual physical exam completed. Routine labs for health maintenance ordered, see below. PHM updated.  - CBC with Differential/Platelet - CMP14+EGFR - Lipid Profile  2. Mixed hyperlipidemia Routine labs ordered  - CBC with Differential/Platelet - CMP14+EGFR - Lipid Profile  3. Vitamin D deficiency Routine lab ordered  - Vitamin D (25 hydroxy)  4. Unspecified menopausal and perimenopausal disorder Continue estradiol as prescribed . - estradiol (ESTRACE) 1 MG tablet; Take 1 tablet (1 mg total) by mouth daily.  Dispense: 90 tablet; Refill: 3  5. Pinguecula of right eye Seeing ophthalmology  6. Encounter for screening mammogram for malignant neoplasm of breast Routine mammogram ordered  - MM 3D SCREENING MAMMOGRAM BILATERAL BREAST; Future   General Counseling: Yuvia verbalizes understanding of the findings of todays visit and agrees with plan of treatment. I have discussed any further diagnostic evaluation that may be needed or ordered today. We also reviewed her medications today. she has been encouraged to call the office with any questions or concerns that should arise related to todays visit.    Orders Placed This Encounter  Procedures   MM 3D SCREENING MAMMOGRAM BILATERAL BREAST   CBC with Differential/Platelet   CMP14+EGFR   Lipid Profile   Vitamin D (25 hydroxy)    Meds ordered this encounter  Medications   estradiol  (ESTRACE) 1 MG tablet    Sig: Take 1 tablet (1 mg total) by mouth daily.    Dispense:  90 tablet    Refill:  3    Return in about 1 year (around 09/21/2024) for CPE, Tenzin Pavon PCP and otherwise as needed. will call with lab results. .   Total time spent:30 Minutes Time spent includes review of chart, medications, test results, and follow up plan with the patient.   [place] Controlled Substance Database was reviewed by me.  This patient was seen by Sallyanne Kuster, FNP-C in collaboration with Dr. Beverely Risen as a part of collaborative care agreement.  Heather Wolf R. Tedd Sias, MSN, FNP-C Internal medicine

## 2023-10-04 DIAGNOSIS — E559 Vitamin D deficiency, unspecified: Secondary | ICD-10-CM | POA: Diagnosis not present

## 2023-10-04 DIAGNOSIS — Z0001 Encounter for general adult medical examination with abnormal findings: Secondary | ICD-10-CM | POA: Diagnosis not present

## 2023-10-04 DIAGNOSIS — E782 Mixed hyperlipidemia: Secondary | ICD-10-CM | POA: Diagnosis not present

## 2023-10-05 LAB — LIPID PANEL
Chol/HDL Ratio: 3.3 {ratio} (ref 0.0–4.4)
Cholesterol, Total: 273 mg/dL — ABNORMAL HIGH (ref 100–199)
HDL: 83 mg/dL (ref >39)
LDL Chol Calc (NIH): 182 mg/dL — ABNORMAL HIGH (ref 0–99)
Triglycerides: 55 mg/dL (ref 0–149)
VLDL Cholesterol Cal: 8 mg/dL (ref 5–40)

## 2023-10-05 LAB — CBC WITH DIFFERENTIAL/PLATELET
Basophils Absolute: 0 10*3/uL (ref 0.0–0.2)
Basos: 1 %
EOS (ABSOLUTE): 0.1 10*3/uL (ref 0.0–0.4)
Eos: 1 %
Hematocrit: 41.8 % (ref 34.0–46.6)
Hemoglobin: 14.1 g/dL (ref 11.1–15.9)
Immature Grans (Abs): 0 10*3/uL (ref 0.0–0.1)
Immature Granulocytes: 0 %
Lymphocytes Absolute: 1.1 10*3/uL (ref 0.7–3.1)
Lymphs: 26 %
MCH: 29.7 pg (ref 26.6–33.0)
MCHC: 33.7 g/dL (ref 31.5–35.7)
MCV: 88 fL (ref 79–97)
Monocytes Absolute: 0.3 10*3/uL (ref 0.1–0.9)
Monocytes: 6 %
Neutrophils Absolute: 2.9 10*3/uL (ref 1.4–7.0)
Neutrophils: 66 %
Platelets: 259 10*3/uL (ref 150–450)
RBC: 4.74 x10E6/uL (ref 3.77–5.28)
RDW: 12.2 % (ref 11.7–15.4)
WBC: 4.4 10*3/uL (ref 3.4–10.8)

## 2023-10-05 LAB — CMP14+EGFR
ALT: 16 [IU]/L (ref 0–32)
AST: 18 [IU]/L (ref 0–40)
Albumin: 4.5 g/dL (ref 3.8–4.9)
Alkaline Phosphatase: 74 [IU]/L (ref 44–121)
BUN/Creatinine Ratio: 14 (ref 9–23)
BUN: 11 mg/dL (ref 6–24)
Bilirubin Total: 0.3 mg/dL (ref 0.0–1.2)
CO2: 25 mmol/L (ref 20–29)
Calcium: 9.6 mg/dL (ref 8.7–10.2)
Chloride: 102 mmol/L (ref 96–106)
Creatinine, Ser: 0.79 mg/dL (ref 0.57–1.00)
Globulin, Total: 2.2 g/dL (ref 1.5–4.5)
Glucose: 88 mg/dL (ref 70–99)
Potassium: 4.5 mmol/L (ref 3.5–5.2)
Sodium: 140 mmol/L (ref 134–144)
Total Protein: 6.7 g/dL (ref 6.0–8.5)
eGFR: 90 mL/min/{1.73_m2} (ref >59)

## 2023-10-05 LAB — VITAMIN D 25 HYDROXY (VIT D DEFICIENCY, FRACTURES): Vit D, 25-Hydroxy: 91.9 ng/mL (ref 30.0–100.0)

## 2023-10-05 NOTE — Progress Notes (Signed)
 Vitamin D is high normal range. Stop any vitamin D supplements to avoid vitamin D becoming higher than normal range. CBC and CMP are normal LDL has continued to climb and is significantly elevated at 182. Patient does need to be on cholesterol lowering medication at least a small dose. I can prescribe rosuvastatin 5 mg daily if she is agreeable.

## 2023-10-29 ENCOUNTER — Encounter: Payer: Self-pay | Admitting: Nurse Practitioner

## 2023-11-21 ENCOUNTER — Ambulatory Visit
Admission: RE | Admit: 2023-11-21 | Discharge: 2023-11-21 | Disposition: A | Discharge status: Home / Self Care | Payer: Self-pay | Source: Ambulatory Visit | Attending: Nurse Practitioner | Admitting: Nurse Practitioner

## 2023-11-21 DIAGNOSIS — Z1231 Encounter for screening mammogram for malignant neoplasm of breast: Secondary | ICD-10-CM | POA: Diagnosis not present

## 2023-12-06 DIAGNOSIS — N951 Menopausal and female climacteric states: Secondary | ICD-10-CM | POA: Diagnosis not present

## 2023-12-06 DIAGNOSIS — Z1329 Encounter for screening for other suspected endocrine disorder: Secondary | ICD-10-CM | POA: Diagnosis not present

## 2023-12-13 DIAGNOSIS — R232 Flushing: Secondary | ICD-10-CM | POA: Diagnosis not present

## 2023-12-13 DIAGNOSIS — N958 Other specified menopausal and perimenopausal disorders: Secondary | ICD-10-CM | POA: Diagnosis not present

## 2023-12-13 DIAGNOSIS — F419 Anxiety disorder, unspecified: Secondary | ICD-10-CM | POA: Diagnosis not present

## 2023-12-13 DIAGNOSIS — N951 Menopausal and female climacteric states: Secondary | ICD-10-CM | POA: Diagnosis not present

## 2024-01-13 DIAGNOSIS — N951 Menopausal and female climacteric states: Secondary | ICD-10-CM | POA: Diagnosis not present

## 2024-03-12 DIAGNOSIS — N951 Menopausal and female climacteric states: Secondary | ICD-10-CM | POA: Diagnosis not present

## 2024-05-06 ENCOUNTER — Inpatient Hospital Stay: Admission: RE | Admit: 2024-05-06 | Discharge: 2024-05-06

## 2024-05-06 VITALS — BP 123/86 | HR 71 | Temp 98.1°F | Resp 18

## 2024-05-06 DIAGNOSIS — S76212A Strain of adductor muscle, fascia and tendon of left thigh, initial encounter: Secondary | ICD-10-CM | POA: Diagnosis not present

## 2024-05-06 NOTE — Discharge Instructions (Signed)
 Your evaluated for the pain to your groin which based on the description of your symptoms and the exam is most consistent with muscular pain  Begin to take ibuprofen 600 800 mg every 6-8 hours consistently for few days to help reduce internal inflammation and help with pain, as pain lessens you may start to ease off on dosage and frequency of medication  Inside your packet or stretches for the groin and thigh, do so as tolerable  May massage to the affected area as tolerable  May apply heat over the affected area in 10 to 15-minute intervals  When lying and sitting cushion the body will placing pillows between the knees and underneath the hip  If your symptoms continue to persist you may follow-up with urgent care as needed for reevaluation

## 2024-05-06 NOTE — ED Provider Notes (Signed)
 Heather Wolf    CSN: [id number] Arrival date & time: 05/06/24  1107      History   Chief Complaint Chief Complaint  Patient presents with   Groin Pain    I am experiencing pain in the groin area and down the left crease/side of my upper leg. It is aggravated by standing for long periods, extending leg too far, lifting, and over activity. I thought I had pulled a muscle but it has worsened. - Entered by patient    HPI Heather Wolf is a 53 y.o. female.   Patient presents for evaluation of intermittent left groin pain radiating into the left thigh beginning 5 days ago.  Endorses symptoms worsening 2 days ago causing pain with all movement such as walking and standing, changing in position and lying, was interfering with sleep.  Endorses that she rested 1 day ago and symptoms have improved since.  Over the last month she has completed 2 moves requiring pushing pulling and lifting.  Denies numbness or tingling.  Has full range of motion.  Past Medical History:  Diagnosis Date   Depression     Patient Active Problem List   Diagnosis Date Noted   Hyperlipidemia LDL goal <100 06/17/2019   Encounter for general adult medical examination with abnormal findings 05/11/2019   Other fatigue 05/11/2019   Raynaud's disease without gangrene 05/11/2019   Multinodular goiter 05/11/2019   Screening for breast cancer 05/08/2018   Unspecified menopausal and perimenopausal disorder 05/08/2018   Generalized anxiety disorder 05/08/2018   Vitamin D  deficiency 05/08/2018   Dysuria 05/08/2018    Past Surgical History:  Procedure Laterality Date   ABDOMINAL HYSTERECTOMY      OB History   No obstetric history on file.      Home Medications    Prior to Admission medications   Medication Sig Start Date End Date Taking? Authorizing Provider  progesterone  (PROMETRIUM ) 100 MG capsule Take 100-200 mg by mouth at bedtime. 03/14/24  Yes [provider]  5-Hydroxytryptophan  (5-HTP) 200 MG TABS Take 1 tablet by mouth at bedtime.    [provider]  estradiol  (ESTRACE ) 1 MG tablet Take 1 tablet (1 mg total) by mouth daily. 09/22/23   Heather Fish, NP  sertraline  (ZOLOFT ) 50 MG tablet TAKE 1 TABLET BY MOUTH EVERY DAY 09/06/23   Heather Fish, NP    Family History Family History  Problem Relation Age of Onset   Hypertension Mother    Heart attack Father    Breast cancer Neg Hx     Social History Social History   Tobacco Use   Smoking status: Never   Smokeless tobacco: Never  Vaping Use   Vaping status: Never Used  Substance Use Topics   Alcohol use: Yes    Comment: ocassional   Drug use: Never     Allergies   Patient has no known allergies.   Review of Systems Review of Systems   Physical Exam Triage Vital Signs ED Triage Vitals  Encounter Vitals Group     BP 05/06/24 1121 123/86     Girls Systolic BP Percentile --      Girls Diastolic BP Percentile --      Boys Systolic BP Percentile --      Boys Diastolic BP Percentile --      Pulse Rate 05/06/24 1121 71     Resp 05/06/24 1121 18     Temp 05/06/24 1121 98.1 F (36.7 C)     Temp  Source 05/06/24 1121 Oral     SpO2 05/06/24 1121 97 %     Weight --      Height --      Head Circumference --      Peak Flow --      Pain Score 05/06/24 1119 0     Pain Loc --      Pain Education --      Exclude from Growth Chart --    No data found.  Updated Vital Signs BP 123/86 (BP Location: Left Arm)   Pulse 71   Temp 98.1 F (36.7 C) (Oral)   Resp 18   SpO2 97%   Visual Acuity Right Eye Distance:   Left Eye Distance:   Bilateral Distance:    Right Eye Near:   Left Eye Near:    Bilateral Near:     Physical Exam Constitutional:      Appearance: Normal appearance.  Eyes:     Extraocular Movements: Extraocular movements intact.  Musculoskeletal:     Comments: Unable to close tenderness to the left groin, no ecchymosis swelling or deformity, has full range of  motion of the left hip, 2+ femoral pulse, able to bear weight to the left lower extremity  Neurological:     Mental Status: She is alert and oriented to person, place, and time.      UC Treatments / Results  Labs (all labs ordered are listed, but only abnormal results are displayed) Labs Reviewed - No data to display  EKG   Radiology No results found.  Procedures Procedures (including critical care time)  Medications Ordered in UC Medications - No data to display  Initial Impression / Assessment and Plan / UC Course  I have reviewed the triage vital signs and the nursing notes.  Pertinent labs & imaging results that were available during my care of the patient were reviewed by me and considered in my medical decision making (see chart for details).  Strain of the groin, left, initial encounter  Symptomology and examination is most consistent with muscular etiology, discussed with patient, declined Toradol or any prescription medications, has not attempted use of over-the-counter medications therefore recommended NSAIDs and discussed administration, recommended supportive care through [facility] and given written handout of stretches, advise follow-up with urgent care or primary doctor if symptoms continue to persist or worsen Final Clinical Impressions(s) / UC Diagnoses   Final diagnoses:  Strain of groin, left, initial encounter     Discharge Instructions      Your evaluated for the pain to your groin which based on the description of your symptoms and the exam is most consistent with muscular pain  Begin to take ibuprofen 600 800 mg every 6-8 hours consistently for few days to help reduce internal inflammation and help with pain, as pain lessens you may start to ease off on dosage and frequency of medication  Inside your packet or stretches for the groin and thigh, do so as tolerable  May massage to the affected area as tolerable  May apply heat over the affected area in  10 to 15-minute intervals  When lying and sitting cushion the body will placing pillows between the knees and underneath the hip  If your symptoms continue to persist you may follow-up with urgent care as needed for reevaluation   ED Prescriptions   None    PDMP not reviewed this encounter.   Teresa Shelba [place], [place] 05/06/24 1212

## 2024-05-06 NOTE — ED Triage Notes (Signed)
 Patient reports intermittent pain to left groin area that radiates right upper thigh area that started 4 days ago. Patient unsure if she pulled a muscle. Patient states that she has been lifting heavy objects also. Patient denies pain at this time.

## 2024-06-13 DIAGNOSIS — N951 Menopausal and female climacteric states: Secondary | ICD-10-CM | POA: Diagnosis not present

## 2024-06-15 DIAGNOSIS — N958 Other specified menopausal and perimenopausal disorders: Secondary | ICD-10-CM | POA: Diagnosis not present

## 2024-06-15 DIAGNOSIS — M8580 Other specified disorders of bone density and structure, unspecified site: Secondary | ICD-10-CM | POA: Diagnosis not present

## 2024-06-15 DIAGNOSIS — N951 Menopausal and female climacteric states: Secondary | ICD-10-CM | POA: Diagnosis not present

## 2024-06-18 DIAGNOSIS — M5413 Radiculopathy, cervicothoracic region: Secondary | ICD-10-CM | POA: Diagnosis not present

## 2024-06-18 DIAGNOSIS — M9901 Segmental and somatic dysfunction of cervical region: Secondary | ICD-10-CM | POA: Diagnosis not present

## 2024-06-18 DIAGNOSIS — M9903 Segmental and somatic dysfunction of lumbar region: Secondary | ICD-10-CM | POA: Diagnosis not present

## 2024-06-18 DIAGNOSIS — M9902 Segmental and somatic dysfunction of thoracic region: Secondary | ICD-10-CM | POA: Diagnosis not present

## 2024-06-25 DIAGNOSIS — M9903 Segmental and somatic dysfunction of lumbar region: Secondary | ICD-10-CM | POA: Diagnosis not present

## 2024-06-25 DIAGNOSIS — M5413 Radiculopathy, cervicothoracic region: Secondary | ICD-10-CM | POA: Diagnosis not present

## 2024-06-25 DIAGNOSIS — M9901 Segmental and somatic dysfunction of cervical region: Secondary | ICD-10-CM | POA: Diagnosis not present

## 2024-06-25 DIAGNOSIS — M9902 Segmental and somatic dysfunction of thoracic region: Secondary | ICD-10-CM | POA: Diagnosis not present

## 2024-07-02 DIAGNOSIS — M9902 Segmental and somatic dysfunction of thoracic region: Secondary | ICD-10-CM | POA: Diagnosis not present

## 2024-07-02 DIAGNOSIS — M9901 Segmental and somatic dysfunction of cervical region: Secondary | ICD-10-CM | POA: Diagnosis not present

## 2024-07-02 DIAGNOSIS — M9903 Segmental and somatic dysfunction of lumbar region: Secondary | ICD-10-CM | POA: Diagnosis not present

## 2024-07-02 DIAGNOSIS — M5413 Radiculopathy, cervicothoracic region: Secondary | ICD-10-CM | POA: Diagnosis not present

## 2024-07-16 DIAGNOSIS — M9901 Segmental and somatic dysfunction of cervical region: Secondary | ICD-10-CM | POA: Diagnosis not present

## 2024-07-16 DIAGNOSIS — M9902 Segmental and somatic dysfunction of thoracic region: Secondary | ICD-10-CM | POA: Diagnosis not present

## 2024-07-16 DIAGNOSIS — M5413 Radiculopathy, cervicothoracic region: Secondary | ICD-10-CM | POA: Diagnosis not present

## 2024-07-16 DIAGNOSIS — M9903 Segmental and somatic dysfunction of lumbar region: Secondary | ICD-10-CM | POA: Diagnosis not present

## 2024-09-24 ENCOUNTER — Encounter: Payer: BC Managed Care – PPO | Admitting: Nurse Practitioner
